# Patient Record
Sex: Male | Born: 1945
Health system: Southern US, Community
[De-identification: ages and names within clinical notes are randomized; demographics above are authoritative.]

## PROBLEM LIST (undated history)

## (undated) DIAGNOSIS — N400 Enlarged prostate without lower urinary tract symptoms: Secondary | ICD-10-CM

## (undated) DIAGNOSIS — T7840XA Allergy, unspecified, initial encounter: Secondary | ICD-10-CM

## (undated) DIAGNOSIS — I2510 Atherosclerotic heart disease of native coronary artery without angina pectoris: Secondary | ICD-10-CM

## (undated) DIAGNOSIS — L719 Rosacea, unspecified: Secondary | ICD-10-CM

## (undated) DIAGNOSIS — E785 Hyperlipidemia, unspecified: Secondary | ICD-10-CM

## (undated) DIAGNOSIS — M1990 Unspecified osteoarthritis, unspecified site: Secondary | ICD-10-CM

## (undated) DIAGNOSIS — B2790 Infectious mononucleosis, unspecified without complication: Secondary | ICD-10-CM

## (undated) DIAGNOSIS — I456 Pre-excitation syndrome: Secondary | ICD-10-CM

## (undated) DIAGNOSIS — R42 Dizziness and giddiness: Secondary | ICD-10-CM

## (undated) HISTORY — DX: Benign prostatic hyperplasia without lower urinary tract symptoms: N40.0

## (undated) HISTORY — DX: Infectious mononucleosis, unspecified without complication: B27.90

## (undated) HISTORY — DX: Allergy, unspecified, initial encounter: T78.40XA

## (undated) HISTORY — DX: Rosacea, unspecified: L71.9

## (undated) HISTORY — DX: Atherosclerotic heart disease of native coronary artery without angina pectoris: I25.10

## (undated) HISTORY — DX: Hyperlipidemia, unspecified: E78.5

## (undated) HISTORY — DX: Dizziness and giddiness: R42

## (undated) HISTORY — PX: COLONOSCOPY: SHX174

---
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
## 1974-11-09 HISTORY — PX: LAMINECTOMY: SHX219

## 1980-11-09 HISTORY — PX: VASECTOMY: SHX75

## 1981-11-09 HISTORY — PX: NASAL SEPTUM SURGERY: SHX37

## 1988-11-09 HISTORY — PX: SHOULDER SURGERY: SHX246

## 2000-10-28 ENCOUNTER — Other Ambulatory Visit: Admission: RE | Admit: 2000-10-28 | Discharge: 2000-10-28 | Payer: Self-pay | Admitting: Internal Medicine

## 2000-10-28 ENCOUNTER — Encounter (INDEPENDENT_AMBULATORY_CARE_PROVIDER_SITE_OTHER): Payer: Self-pay | Admitting: Specialist

## 2003-12-20 ENCOUNTER — Ambulatory Visit (HOSPITAL_COMMUNITY): Admission: RE | Admit: 2003-12-20 | Discharge: 2003-12-20 | Payer: Self-pay | Admitting: Neurosurgery

## 2004-11-08 ENCOUNTER — Ambulatory Visit (HOSPITAL_COMMUNITY): Admission: RE | Admit: 2004-11-08 | Discharge: 2004-11-08 | Payer: Self-pay | Admitting: *Deleted

## 2005-09-23 ENCOUNTER — Ambulatory Visit: Payer: Self-pay | Admitting: Internal Medicine

## 2005-10-05 ENCOUNTER — Encounter (INDEPENDENT_AMBULATORY_CARE_PROVIDER_SITE_OTHER): Payer: Self-pay | Admitting: *Deleted

## 2005-10-05 ENCOUNTER — Ambulatory Visit: Payer: Self-pay | Admitting: Internal Medicine

## 2010-05-07 ENCOUNTER — Encounter: Admission: RE | Admit: 2010-05-07 | Discharge: 2010-05-07 | Payer: Self-pay | Admitting: Cardiology

## 2010-06-06 ENCOUNTER — Encounter (INDEPENDENT_AMBULATORY_CARE_PROVIDER_SITE_OTHER): Payer: Self-pay | Admitting: *Deleted

## 2010-08-28 ENCOUNTER — Encounter (INDEPENDENT_AMBULATORY_CARE_PROVIDER_SITE_OTHER): Payer: Self-pay | Admitting: *Deleted

## 2010-09-29 ENCOUNTER — Encounter (INDEPENDENT_AMBULATORY_CARE_PROVIDER_SITE_OTHER): Payer: Self-pay | Admitting: *Deleted

## 2010-09-30 ENCOUNTER — Ambulatory Visit: Payer: Self-pay | Admitting: Internal Medicine

## 2010-10-09 LAB — HM COLONOSCOPY: HM Colonoscopy: NORMAL

## 2010-10-21 ENCOUNTER — Ambulatory Visit: Payer: Self-pay | Admitting: Internal Medicine

## 2010-10-22 ENCOUNTER — Encounter: Payer: Self-pay | Admitting: Internal Medicine

## 2010-11-29 ENCOUNTER — Encounter: Payer: Self-pay | Admitting: Neurosurgery

## 2010-12-09 NOTE — Letter (Signed)
Summary: [facility] Instructions  [facility]  [address] [place], [place] [address]   Phone: [phone]  Fax: [phone]       Chase [name]    Jan 24, 1946    MRN: [id number]       Procedure Day /Date:  Tuesday 10/21/2010     Arrival Time:  8:30 am     Procedure Time: 9:30 am     Location of Procedure:                    _x _  [facility] (4th Floor)   PREPARATION FOR COLONOSCOPY WITH MIRALAX  Starting 5 days prior to your procedure Thursday 12/8 do not eat nuts, seeds, popcorn, corn, beans, peas,  salads, or any raw vegetables.  Do not take any fiber supplements (e.g. Metamucil, Citrucel, and Benefiber). ____________________________________________________________________________________________________   THE DAY BEFORE YOUR PROCEDURE         DATE: Monday 12/12  1   Drink clear liquids the entire day-NO SOLID FOOD  2   Do not drink anything colored red or purple.  Avoid juices with pulp.  No orange juice.  3   Drink at least 64 oz. (8 glasses) of fluid/clear liquids during the day to prevent dehydration and help the prep work efficiently.  CLEAR LIQUIDS INCLUDE: Water Jello Ice Popsicles Tea (sugar ok, no milk/cream) Powdered fruit flavored drinks Coffee (sugar ok, no milk/cream) Gatorade Juice: apple, white grape, white cranberry  Lemonade Clear bullion, consomm, broth Carbonated beverages (any kind) Strained chicken noodle soup Hard Candy  4   Mix the entire bottle of Miralax with 64 oz. of Gatorade/Powerade in the morning and put in the refrigerator to chill.  5   At 3:00 pm take 2 Dulcolax/Bisacodyl tablets.  6   At 4:30 pm take one Reglan/Metoclopramide tablet.  7  Starting at 5:00 pm drink one 8 oz glass of the Miralax mixture every 15-20 minutes until you have finished drinking the entire 64 oz.  You should finish drinking prep around 7:30 or 8:00 pm.  8   If you are nauseated, you may take the 2nd Reglan/Metoclopramide  tablet at 6:30 pm.        9    At 8:00 pm take 2 more DULCOLAX/Bisacodyl tablets.     THE DAY OF YOUR PROCEDURE      DATE:  Tuesday 12/13  You may drink clear liquids until 7:30 am  (2 HOURS BEFORE PROCEDURE).   MEDICATION INSTRUCTIONS  Unless otherwise instructed, you should take regular prescription medications with a small sip of water as early as possible the morning of your procedure.         OTHER INSTRUCTIONS  You will need a responsible adult at least 63 years of age to accompany you and drive you home.   This person must remain in the waiting room during your procedure.  Wear loose fitting clothing that is easily removed.  Leave jewelry and other valuables at home.  However, you may wish to bring a book to read or an iPod/MP3 player to listen to music as you wait for your procedure to start.  Remove all body piercing jewelry and leave at home.  Total time from sign-in until discharge is approximately 2-3 hours.  You should go home directly after your procedure and rest.  You can resume normal activities the day after your procedure.  The day of your procedure you should not:   Drive   Make  legal decisions   Operate machinery   Drink alcohol   Return to work  You will receive specific instructions about eating, activities and medications before you leave.   The above instructions have been reviewed and explained to me by   Ezra Sites RN  September 30, 2010 9:41 AM     I fully understand and can verbalize these instructions _____________________________ Date _______

## 2010-12-09 NOTE — Letter (Signed)
Summary: Previsit letter  [facility]  [address] [place], [place] [address]   Phone: [phone]  Fax: [phone]       06/06/2010 MRN: [id number]  Chase [name] [address] [place], [place]  [address]  Dear [name],  Welcome to the Gastroenterology Division at [facility].    You are scheduled to see a nurse for your pre-procedure visit on July 17, 2010 at 8:30am on the 3rd floor at [facility], [address]  We ask that you try to arrive at our office 15 minutes prior to your appointment time to allow for check-in.  Your nurse visit will consist of discussing your medical and surgical history, your immediate family medical history, and your medications.    Please bring a complete list of all your medications or, if you prefer, bring the medication bottles and we will list them.  We will need to be aware of both prescribed and over the counter drugs.  We will need to know exact dosage information as well.  If you are on blood thinners (Coumadin, Plavix, Aggrenox, Ticlid, etc.) please call our office today/prior to your appointment, as we need to consult with your physician about holding your medication.   Please be prepared to read and sign documents such as consent forms, a financial agreement, and acknowledgement forms.  If necessary, and with your consent, a friend or relative is welcome to sit-in on the nurse visit with you.  Please bring your insurance card so that we may make a copy of it.  If your insurance requires a referral to see a specialist, please bring your referral form from your primary care physician.  No co-pay is required for this nurse visit.     If you cannot keep your appointment, please call [phone] to cancel or reschedule prior to your appointment date.  This allows [place] the opportunity to schedule an appointment for another patient in need of care.    Thank you for choosing [facility] for your  medical needs.  We appreciate the opportunity to care for you.  Please visit [place] at our website  to learn more about our practice.                     Sincerely.                                                                                                                   The Gastroenterology Division

## 2010-12-09 NOTE — Miscellaneous (Signed)
Summary: [facility]  Clinical Lists Changes  Medications: Added new medication of MIRALAX   POWD (POLYETHYLENE GLYCOL 3350) As per prep  instructions. - Signed Added new medication of DULCOLAX 5 MG  TBEC (BISACODYL) Day before procedure take 2 at 3pm and 2 at 8pm. - Signed Added new medication of REGLAN 10 MG  TABS (METOCLOPRAMIDE HCL) As per prep instructions. - Signed Rx of MIRALAX   POWD (POLYETHYLENE GLYCOL 3350) As per prep  instructions.;  #255gm x 0;  Signed;  Entered by: Ezra Sites RN;  Authorized by: Hart Carwin MD;  Method used: Electron[facility] to [facility] [address] [phone]*, [address] [place], [place], [place]  [address], Ph: [phone] or [phone], Fax: [phone] Rx of DULCOLAX 5 MG  TBEC (BISACODYL) Day before procedure take 2 at 3pm and 2 at 8pm.;  #4 x 0;  Signed;  Entered by: Ezra Sites RN;  Authorized by: Hart Carwin MD;  Method used: Electron[facility] to [facility] [address] [phone]*, [address], [place], [place], [place]  [address], Ph: [phone] or [phone], Fax: [phone] Rx of REGLAN 10 MG  TABS (METOCLOPRAMIDE HCL) As per prep instructions.;  #2 x 0;  Signed;  Entered by: Ezra Sites RN;  Authorized by: Hart Carwin MD;  Method used: Electronically to [facility] [address] [phone]*, [address], [place], [place], [place]  [address], Ph: [phone] or [phone], Fax: [phone] Allergies: Added new allergy or adverse reaction of PCN Observations: Added new observation of NKA: F (09/30/2010 9:14)    Prescriptions: REGLAN 10 MG  TABS (METOCLOPRAMIDE HCL) As per prep instructions.  #2 x 0   Entered by:   Ezra Sites RN   Authorized by:   Hart Carwin MD   Signed by:   Ezra Sites RN on 09/30/2010   Method used:   [facility]ronically to        [facility] [address] [phone]* (retail)       [address]       [place], [place]  [address]       Ph: [phone] or [phone]       Fax: [phone]   RxID:    [id number] DULCOLAX 5 MG  TBEC (BISACODYL) Day before procedure take 2 at 3pm and 2 at 8pm.  #4 x 0   Entered by:   Ezra Sites RN   Authorized by:   Hart Carwin MD   Signed by:   Ezra Sites RN on 09/30/2010   Method used:   [facility]ronically to        [facility] [address] [phone]* (retail)       [address]       [place], [place]  [address]       Ph: [phone] or [phone]       Fax: [phone]   RxID:   [id number] MIRALAX   POWD (POLYETHYLENE GLYCOL 3350) As per prep  instructions.  #255gm x 0   Entered by:   Ezra Sites RN   Authorized by:   Hart Carwin MD   Signed by:   Ezra Sites RN on 09/30/2010   Method used:   Electronically to        [facility] [address] [phone]* (retail)       [address]       [place]  [place], [place]  [address]       Ph: [phone] or [phone]       Fax: [phone]   RxID:   [id number]

## 2010-12-09 NOTE — Letter (Signed)
Summary: Pre Visit Letter Revised  [facility]  [address] [place], [place] [address]   Phone: [phone]  Fax: [phone]        08/28/2010 MRN: [id number] Chase [name] [address] [place], [place]  [address]             Procedure Date:  10/21/2010   Welcome to the Gastroenterology Division at [facility].    You are scheduled to see a nurse for your pre-procedure visit on 09/30/2010 at 9:30AM on the 3rd floor at [facility], [address].  We ask that you try to arrive at our office 15 minutes prior to your appointment time to allow for check-in.  Please take a minute to review the attached form.  If you answer "Yes" to one or more of the questions on the first page, we ask that you call the person listed at your earliest opportunity.  If you answer "No" to all of the questions, please complete the rest of the form and bring it to your appointment.    Your nurse visit will consist of discussing your medical and surgical history, your immediate family medical history, and your medications.   If you are unable to list all of your medications on the form, please bring the medication bottles to your appointment and we will list them.  We will need to be aware of both prescribed and over the counter drugs.  We will need to know exact dosage information as well.    Please be prepared to read and sign documents such as consent forms, a financial agreement, and acknowledgement forms.  If necessary, and with your consent, a friend or relative is welcome to sit-in on the nurse visit with you.  Please bring your insurance card so that we may make a copy of it.  If your insurance requires a referral to see a specialist, please bring your referral form from your primary care physician.  No co-pay is required for this nurse visit.     If you cannot keep your appointment, please call [phone] to cancel or reschedule prior to your appointment date.  This  allows [place] the opportunity to schedule an appointment for another patient in need of care.    Thank you for choosing  Gastroenterology for your medical needs.  We appreciate the opportunity to care for you.  Please visit [place] at our website  to learn more about our practice.  Sincerely, The Gastroenterology Division

## 2010-12-11 NOTE — Procedures (Signed)
Summary: Colonoscopy  Patient: Chase [name] Note: All result statuses are Final unless otherwise noted.  Tests: (1) Colonoscopy (COL)   COL Colonoscopy           DONE     [facility]     [address]     [place], [place]  [address]          COLONOSCOPY PROCEDURE REPORT          PATIENT:  [name], Chase  MR#:  [id number]     BIRTHDATE:  04/26/1946, 64 yrs. old  GENDER:  male     ENDOSCOPIST:  Dora M. Brodie, MD     REF. BY:  Thomas Brackbill, M.D.     PROCEDURE DATE:  10/21/2010     PROCEDURE:  Colonoscopy 45378     ASA CLASS:  Class I     INDICATIONS:  history of hyperplastic polyps hyperplastic polyp in     2001 and in 2006     MEDICATIONS:   Versed 10 mg, Fentanyl 100 mcg          DESCRIPTION OF PROCEDURE:   After the risks benefits and     alternatives of the procedure were thoroughly explained, informed     consent was obtained.  Digital rectal exam was performed and     revealed no rectal masses.   The [id number] [id number] endoscope was     introduced through the anus and advanced to the cecum, which was     identified by both the appendix and ileocecal valve, without     limitations.  The quality of the prep was good, using MiraLax.     The instrument was then slowly withdrawn as the colon was fully     examined.     <<PROCEDUREIMAGES>>     FINDINGS:  Mild diverticulosis was found in the sigmoid colon (see     image1, image2, and image9).  A diminutive polyp was found. at 15     cm 2 mm polyp The polyp was removed using cold biopsy forceps (see     image11 and image10).  This was otherwise a normal exa     mination of the colon (see image12, image7, image6, image5,     image3, and image4).   Retroflexed views in the rectum revealed no     abnormalities.    The scope was then withdrawn from the patient     and the procedure completed.          COMPLICATIONS:  None     ENDOSCOPIC IMPRESSION:     1) Mild diverticulosis in the sigmoid colon     2) Diminutive  polyp     3) Otherwise normal examination     RECOMMENDATIONS:     1) Await biopsy results     2) high fiber diet     REPEAT EXAM:  In 10 year(s) for.          ______________________________     Dora M. Brodie, MD          CC:          n.     eSIGNED:   Dora M. Brodie at 10/21/2010 10:02 AM          [name], Chase, [id number]  Note: An exclamation mark (!) indicates a result that was not dispersed into the flowsheet. Document Creation Date: 10/21/2010 10:04 AM _______________________________________________________________________  (1) Order result status: Final Collection or observation date-time: 10/21/2010 09:55 Requested   date-time:  Receipt date-time:  Reported date-time:  Referring Physician:   Ordering Physician: Dora Brodie ([id number]) Specimen Source:  Source: [facility] Order Number: [id number] Lab site:   Appended Document: Colonoscopy     Procedures Next Due Date:    Colonoscopy: 10/2020 

## 2010-12-11 NOTE — Letter (Signed)
Summary: Patient Notice- Polyp Results  [facility]  [address] [place], [place] [address]   Phone: [phone]  Fax: [phone]        October 22, 2010 MRN: [id number]    Chase [name] [address] [place], [place]  [address]    Dear [name],  I am pleased to inform you that the colon polyp(s) removed during your recent colonoscopy was (were) found to be benign (no cancer detected) upon pathologic examination.The polyp was hyperplastic ( NOT precancerous)  I recommend you have a repeat colonoscopy examination in 10_ years to look for recurrent polyps, as having colon polyps increases your risk for having recurrent polyps or even colon cancer in the future.  Should you develop new or worsening symptoms of abdominal pain, bowel habit changes or bleeding from the rectum or bowels, please schedule an evaluation with either your primary care physician or with me.  Additional information/recommendations:  _x_ No further action with gastroenterology is needed at this time. Please      follow-up with your primary care physician for your other healthcare      needs.  __ Please call [phone] to schedule a return visit to review your      situation.  __ Please keep your follow-up visit as already scheduled.  __ Continue treatment plan as outlined the day of your exam.  Please call us if you are having persistent problems or have questions about your condition that have not been fully answered at this time.  Sincerely,  Hart Carwin MD  This letter has been electronically signed by your physician.  Appended Document: Patient Notice- Polyp Results Letter Mailed

## 2011-01-27 NOTE — Procedures (Signed)
Summary: Colonoscopy  Patient: [name] Note: All result statuses are Final unless otherwise noted.  Tests: (1) Colonoscopy (COL)   COL Colonoscopy           DONE     [facility]     [address]     [place], [place]  [address]          COLONOSCOPY PROCEDURE REPORT          PATIENT:  Chase [name], Chase [name]  MR#:  [id number]     BIRTHDATE:  May 11, 1946, 64 yrs. old  GENDER:  male     ENDOSCOPIST:  Hedwig Morton. Juanda Chance, MD     REF. BY:  Cassell Clement, M.D.     PROCEDURE DATE:  10/21/2010     PROCEDURE:  Colonoscopy 95621     ASA CLASS:  Class I     INDICATIONS:  history of hyperplastic polyps hyperplastic polyp in     2001 and in 2006     MEDICATIONS:   Versed 10 mg, Fentanyl 100 mcg          DESCRIPTION OF PROCEDURE:   After the risks benefits and     alternatives of the procedure were thoroughly explained, informed     consent was obtained.  Digital rectal exam was performed and     revealed no rectal masses.   The [id number] [id number] endoscope was     introduced through the anus and advanced to the cecum, which was     identified by both the appendix and ileocecal valve, without     limitations.  The quality of the prep was good, using MiraLax.     The instrument was then slowly withdrawn as the colon was fully     examined.     <<PROCEDUREIMAGES>>     FINDINGS:  Mild diverticulosis was found in the sigmoid colon (see     image1, image2, and image9).  A diminutive polyp was found. at 15     cm 2 mm polyp The polyp was removed using cold biopsy forceps (see     image11 and image10).  This was otherwise a normal exa     mination of the colon (see image12, image7, image6, image5,     image3, and image4).   Retroflexed views in the rectum revealed no     abnormalities.    The scope was then withdrawn from the patient     and the procedure completed.          COMPLICATIONS:  None     ENDOSCOPIC IMPRESSION:     1) Mild diverticulosis in the sigmoid colon     2) Diminutive  polyp     3) Otherwise normal examination     RECOMMENDATIONS:     1) Await biopsy results     2) high fiber diet     REPEAT EXAM:  In 10 year(s) for.          ______________________________     Hedwig Morton. Juanda Chance, MD          CC:          n.     eSIGNED:   Hedwig Morton. Brodie at 10/21/2010 10:02 AM          Cherrie Distance, [id number]  Note: An exclamation mark (!) indicates a result that was not dispersed into the flowsheet. Document Creation Date: 10/21/2010 10:04 AM _______________________________________________________________________  (1) Order result status: Final Collection or observation date-time: 10/21/2010 09:55 Requested  date-time:  Receipt date-time:  Reported date-time:  Referring Physician:   Ordering Physician: Lina Sar [phone]) Specimen Source:  Source: Launa Grill Order Number: [id number] Lab site:   Appended Document: Colonoscopy     Procedures Next Due Date:    Colonoscopy: 10/2020

## 2011-09-09 ENCOUNTER — Telehealth: Payer: Self-pay | Admitting: Cardiology

## 2011-09-09 DIAGNOSIS — Z125 Encounter for screening for malignant neoplasm of prostate: Secondary | ICD-10-CM

## 2011-09-09 DIAGNOSIS — Z113 Encounter for screening for infections with a predominantly sexual mode of transmission: Secondary | ICD-10-CM

## 2011-09-09 DIAGNOSIS — Z139 Encounter for screening, unspecified: Secondary | ICD-10-CM

## 2011-09-09 NOTE — Telephone Encounter (Signed)
Pt calling to see when and if he needs a fu  appt  with brackbill

## 2011-09-09 NOTE — Telephone Encounter (Signed)
Scheduled appointment and labs 

## 2011-09-16 ENCOUNTER — Other Ambulatory Visit (INDEPENDENT_AMBULATORY_CARE_PROVIDER_SITE_OTHER): Payer: BC Managed Care – PPO | Admitting: *Deleted

## 2011-09-16 DIAGNOSIS — Z139 Encounter for screening, unspecified: Secondary | ICD-10-CM

## 2011-09-16 DIAGNOSIS — Z125 Encounter for screening for malignant neoplasm of prostate: Secondary | ICD-10-CM

## 2011-09-16 LAB — BASIC METABOLIC PANEL
CO2: 29 mEq/L (ref 19–32)
Calcium: 9.3 mg/dL (ref 8.4–10.5)
Chloride: 103 mEq/L (ref 96–112)
Creatinine, Ser: 1 mg/dL (ref 0.4–1.5)
GFR: 85.06 mL/min (ref >60.00)
Glucose, Bld: 101 mg/dL — ABNORMAL HIGH (ref 70–99)
Potassium: 5.1 mEq/L (ref 3.5–5.1)
Sodium: 139 mEq/L (ref 135–145)

## 2011-09-16 LAB — LIPID PANEL
Cholesterol: 199 mg/dL (ref 0–200)
HDL: 76.7 mg/dL (ref >39.00)
Total CHOL/HDL Ratio: 3
Triglycerides: 65 mg/dL (ref 0.0–149.0)
VLDL: 13 mg/dL (ref 0.0–40.0)

## 2011-09-16 LAB — HEPATIC FUNCTION PANEL
ALT: 29 U/L (ref 0–53)
AST: 31 U/L (ref 0–37)
Albumin: 4.2 g/dL (ref 3.5–5.2)
Alkaline Phosphatase: 59 U/L (ref 39–117)
Total Bilirubin: 0.9 mg/dL (ref 0.3–1.2)
Total Protein: 7.3 g/dL (ref 6.0–8.3)

## 2011-09-16 LAB — PSA: PSA: 2.64 ng/mL (ref 0.10–4.00)

## 2011-09-18 ENCOUNTER — Encounter: Payer: Self-pay | Admitting: Cardiology

## 2011-09-18 ENCOUNTER — Ambulatory Visit (INDEPENDENT_AMBULATORY_CARE_PROVIDER_SITE_OTHER): Payer: BC Managed Care – PPO | Admitting: Cardiology

## 2011-09-18 VITALS — BP 134/94 | HR 69 | Ht 68.0 in | Wt 182.1 lb

## 2011-09-18 DIAGNOSIS — I456 Pre-excitation syndrome: Secondary | ICD-10-CM

## 2011-09-18 NOTE — Progress Notes (Signed)
Chase [name] Date of Birth:  May 23, 1946 [facility] / [facility] [address]   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  The patient has been in good general health.  He has a past history of WPW syndrome.  I first saw him in 1976 at which time he had a pattern of WPW on his EKG.  The WPW pattern persisted until 1991 was not present in 1991 but had been present in 1987.  He has never had tachycardia associated with the WPW.  He had a normal Cardiolite stress test in 1999.  Current Outpatient Prescriptions  Medication Sig Dispense Refill  . ASPIRIN PO Take 81 mg by mouth daily.       . Multiple Vitamin (MULTI-VITAMIN PO) Take by mouth daily.        . Omega-3 Fatty Acids (FISH OIL PO) Take by mouth daily.          Allergies  Allergen Reactions  . Penicillins     REACTION: as child    There is no problem list on file for this patient.   History  Smoking status  . Never Smoker   Smokeless tobacco  . Not on file    History  Alcohol Use No    Family History  Problem Relation Age of Onset  . Cancer Father   . Cancer Mother     Review of Systems: Constitutional: no fever chills diaphoresis or fatigue or change in weight.  Head and neck: no hearing loss, no epistaxis, no photophobia or visual disturbance.  The patient has had some tinnitus.  This involves the left ear.  He will consult Dr. Annalee Genta Respiratory: No cough, shortness of breath or wheezing. Cardiovascular: No chest pain peripheral edema, palpitations. Gastrointestinal: No abdominal distention, no abdominal pain, no change in bowel habits hematochezia or melena. Genitourinary: No dysuria, no frequency, no urgency, no nocturia. Musculoskeletal:No arthralgias, no back pain, no gait disturbance or myalgias. Neurological: No dizziness,  no headaches, no numbness, no seizures, no syncope, no weakness, no tremors. Hematologic: No lymphadenopathy, no easy bruising. Psychiatric: No confusion, no hallucinations, no sleep disturbance.    Physical Exam: Filed Vitals:   09/18/11 1150  BP: 134/94  Pulse: 69    I repeated his blood pressure later in the examination and it was down to 130/80.  The patient appears to be in no distress.  Head and neck exam reveals that the pupils are equal and reactive.  The extraocular movements are full.  There is no scleral icterus.  Mouth and pharynx are benign.  No lymphadenopathy.  No carotid bruits.  The jugular venous pressure is normal.  Thyroid is not enlarged or tender.  The tympanic membranes are normal bilaterally  Chest is clear to percussion and auscultation.  No rales or rhonchi.  Expansion of the chest is symmetrical.  Heart reveals no abnormal lift or heave.  First and second heart sounds are normal.  There is no murmur gallop rub or click.  The abdomen is soft and nontender.  Bowel sounds are normoactive.  There is no hepatosplenomegaly or mass.  There are no abdominal bruits.  Rectal exam normal stool benzidine negative.  Prostate reveals no nodules  Extremities reveal no phlebitis or edema.  Pedal pulses are good.  There is no  cyanosis or clubbing.  Neurologic exam is normal strength and no lateralizing weakness.  No sensory deficits.  Integument reveals no rash EKG shows normal sinus rhythm and is within normal limits.  No evidence of preexcitation  Assessment / Plan: Continue same regimen.  Continue regular exercise.  Recheck in one year for followup office visit EKG lipid panel hepatic function panel basal metabolic panel PSA and CBC.

## 2011-09-18 NOTE — Assessment & Plan Note (Signed)
The patient has feeling well.  He has not had any chest pain or shortness of breath since we last saw him he had a colonoscopy in December 2011 which was negative except for some diverticulosis and a single diminutive polyp and his gastroenterologist Dr. Julio Alm told him he would not need another colonoscopy for 10 years.

## 2011-09-18 NOTE — Patient Instructions (Signed)
Your physician recommends that you continue on your current medications as directed. Please refer to the Current Medication list given to you today.  Your physician wants you to follow-up in: 1 year with fasting labs You will receive a reminder letter in the mail two months in advance. If you don't receive a letter, please call our office to schedule the follow-up appointment.  

## 2012-04-23 ENCOUNTER — Emergency Department (HOSPITAL_COMMUNITY)
Admission: EM | Admit: 2012-04-23 | Discharge: 2012-04-23 | Disposition: A | Discharge status: Home / Self Care | Payer: BC Managed Care – PPO | Attending: Emergency Medicine | Admitting: Emergency Medicine

## 2012-04-23 ENCOUNTER — Encounter (HOSPITAL_COMMUNITY): Payer: Self-pay | Admitting: Emergency Medicine

## 2012-04-23 DIAGNOSIS — S01501A Unspecified open wound of lip, initial encounter: Secondary | ICD-10-CM | POA: Insufficient documentation

## 2012-04-23 DIAGNOSIS — Z7982 Long term (current) use of aspirin: Secondary | ICD-10-CM | POA: Insufficient documentation

## 2012-04-23 DIAGNOSIS — IMO0002 Reserved for concepts with insufficient information to code with codable children: Secondary | ICD-10-CM | POA: Insufficient documentation

## 2012-04-23 DIAGNOSIS — I2510 Atherosclerotic heart disease of native coronary artery without angina pectoris: Secondary | ICD-10-CM | POA: Insufficient documentation

## 2012-04-23 DIAGNOSIS — E785 Hyperlipidemia, unspecified: Secondary | ICD-10-CM | POA: Insufficient documentation

## 2012-04-23 DIAGNOSIS — S0181XA Laceration without foreign body of other part of head, initial encounter: Secondary | ICD-10-CM

## 2012-04-23 MED ORDER — TETANUS-DIPHTHERIA TOXOIDS TD 5-2 LFU IM INJ
0.5000 mL | INJECTION | Freq: Once | INTRAMUSCULAR | Status: AC
  Administered 2012-04-23: 0.5 mL via INTRAMUSCULAR
  Filled 2012-04-23: qty 0.5

## 2012-04-23 NOTE — ED Notes (Signed)
Pt reports, that he was using bungy cord to put furniture on truck, bungy got loose and and hit pt in upper lip; pt has lac to R upper lip; no active bleeding

## 2012-04-23 NOTE — Discharge Instructions (Signed)
Facial Laceration A facial laceration is a cut on the face. It can take 1 to 2 years for the scar to heal completely. HOME CARE  For stitches (sutures):  Keep the cut clean and dry.   If you have a bandage (dressing), change it at least once a day. Change the bandage if it gets wet or dirty, or as told by your doctor.   Wash the cut with soap and water 2 times a day. Rinse the cut with water. Pat it dry with a clean towel.   Put a thin layer of medicated cream on the cut as told by your doctor.   You may shower after the first 24 hours. Do not soak the cut in water until the stitches are removed.   Only take medicines as told by your doctor.   Have your stitches removed as told by your doctor.   Do not wear makeup until a few days after your stitches are removed.  For skin adhesive strips:  Keep the cut clean and dry.   Do not get the strips wet. You may take a bath, but be careful to keep the cut dry.   If the cut gets wet, pat it dry with a clean towel.   The strips will fall off on their own. Do not remove the strips that are still stuck to the cut.  For wound glue:  You may shower or take baths. Do not soak or scrub the cut. Do not swim. Avoid heavy sweating until the glue falls off on its own. After a shower or bath, pat the cut dry with a clean towel.   Do not put medicine or makeup on your cut until the glue falls off.   If you have a bandage, do not put tape over the glue.   Avoid lots of sunlight or tanning lamps until the glue falls off. Put sunscreen on the cut for the first year to reduce your scar.   The glue will fall off on its own. Do not pick at the glue.  You may need a tetanus shot if:  You cannot remember when you had your last tetanus shot.   You have never had a tetanus shot.  If you need a tetanus shot and you choose not to have one, you may get tetanus. Sickness from tetanus can be serious. GET HELP RIGHT AWAY IF:   Your cut gets red, painful,  or puffy (swollen).   There is yellowish-white fluid (pus) coming from the cut.   You have chills or a fever.  MAKE SURE YOU:   Understand these instructions.   Will watch your condition.   Will get help right away if you are not doing well or get worse.  Document Released: 04/13/2008 Document Revised: 10/15/2011 Document Reviewed: 04/21/2011 [employer] Patient Information 2012 [employer]. 

## 2012-04-23 NOTE — ED Provider Notes (Addendum)
History   This chart was scribed for Gwyneth Sprout, MD by Shari Heritage. The patient was seen in room STRE6/STRE6. Patient's care was started at 1554.     CSN: [id number]  Arrival date & time 04/23/12  1554   None     Chief Complaint  Patient presents with  . Facial Laceration    (Consider location/radiation/quality/duration/timing/severity/associated sxs/prior treatment) Patient is a 66 y.o. male presenting with skin laceration. The history is provided by the patient. No language interpreter was used.  Laceration  The incident occurred 1 to 2 hours ago. The laceration is located on the face. The laceration is 1 cm in size. The laceration mechanism was a a blunt object. The pain is mild. He reports no foreign bodies present. His tetanus status is out of date.   Chase [name] is a 66 y.o. male who presents to the Emergency Department complaining of a facial laceration to right upper lip. Patient says he was using a bungy cord to put furniture on a truck when the cord got loose and struck him on his upper lip. Patient has no active bleeding. Patient with h/o of hyperlipidemia, mononucleosis, CAD, laminectomy, nasal septum surgery, vasectomy and shoulder surgery. Patient has never smoked.  Past Medical History  Diagnosis Date  . Mild hyperlipidemia   . Mononucleosis 1959  . Dizziness   . CAD (coronary artery disease)     Past Surgical History  Procedure Date  . Laminectomy 1976  . Nasal septum surgery 1983  . Vasectomy 1982  . Shoulder surgery 1990    Family History  Problem Relation Age of Onset  . Cancer Father   . Cancer Mother     History  Substance Use Topics  . Smoking status: Never Smoker   . Smokeless tobacco: Not on file  . Alcohol Use: No     occasionally      Review of Systems A complete 10 system review of systems was obtained and all systems are negative except as noted in the HPI and PMH.   Allergies  Penicillins  Home Medications   Current  Outpatient Rx  Name Route Sig Dispense Refill  . ASPIRIN EC 81 MG PO TBEC Oral Take 81 mg by mouth daily.    . MULTI-VITAMIN PO Oral Take by mouth daily.      Marland Kitchen FISH OIL PO Oral Take by mouth daily.        BP 141/81  Pulse 73  Temp 98.5 F (36.9 C) (Oral)  Resp 18  SpO2 94%  Physical Exam  Nursing note and vitals reviewed. Constitutional: He is oriented to person, place, and time. He appears well-developed and well-nourished.  HENT:  Head: Normocephalic and atraumatic.  Mouth/Throat: Mucous membranes are normal. Normal dentition.    Eyes: Conjunctivae and EOM are normal.  Neck: Neck supple.  Musculoskeletal: Normal range of motion.  Neurological: He is alert and oriented to person, place, and time. No sensory deficit.  Skin: Skin is warm and dry. No rash noted. No erythema.  Psychiatric: He has a normal mood and affect. His behavior is normal.    ED Course  Procedures (including critical care time) DIAGNOSTIC STUDIES: Oxygen Saturation is 94% on room air, adequate by my interpretation.    COORDINATION OF CARE: 4:08PM - Patient informed of current plan for treatment and evaluation and agrees with plan at this time. Will apply wound adhesive and update tetanus shot.   Labs Reviewed - No data to display No results found.  LACERATION REPAIR Performed by: Gwyneth Sprout Authorized byGwyneth Sprout Consent: Verbal consent obtained. Risks and benefits: risks, benefits and alternatives were discussed Consent given by: patient Patient identity confirmed: provided demographic data Prepped and Draped in normal sterile fashion Wound explored  Laceration Location: right upper lip  Laceration Length: 1cm  No Foreign Bodies seen or palpated  Anesthesia: none  Local anesthetic:none    Irrigation method: skin scrub Amount of cleaning: standard  Skin closure: dermabond  Number of sutures: 0  Patient tolerance: Patient tolerated the procedure well with no  immediate complications.  1. Facial laceration       MDM   Patient sustained a laceration to his right upper lip from a bungee cord today. There is no through and through injury no dental involvement. Tetanus shot was updated and the wound was repaired with Dermabond.      I personally performed the services described in this documentation, which was scribed in my presence.  The recorded information has been reviewed and considered.    Gwyneth Sprout, MD 04/23/12 1616  Gwyneth Sprout, MD 04/23/12 1630  Gwyneth Sprout, MD 04/23/12 1631

## 2012-04-24 ENCOUNTER — Encounter (HOSPITAL_COMMUNITY): Payer: Self-pay

## 2012-06-09 ENCOUNTER — Telehealth: Payer: Self-pay | Admitting: Cardiology

## 2012-06-09 ENCOUNTER — Emergency Department (INDEPENDENT_AMBULATORY_CARE_PROVIDER_SITE_OTHER): Payer: BC Managed Care – PPO

## 2012-06-09 ENCOUNTER — Encounter (HOSPITAL_COMMUNITY): Payer: Self-pay

## 2012-06-09 ENCOUNTER — Emergency Department (INDEPENDENT_AMBULATORY_CARE_PROVIDER_SITE_OTHER)
Admission: EM | Admit: 2012-06-09 | Discharge: 2012-06-09 | Disposition: A | Discharge status: Home / Self Care | Payer: BC Managed Care – PPO | Source: Home / Self Care

## 2012-06-09 DIAGNOSIS — R05 Cough: Secondary | ICD-10-CM

## 2012-06-09 DIAGNOSIS — I456 Pre-excitation syndrome: Secondary | ICD-10-CM | POA: Insufficient documentation

## 2012-06-09 HISTORY — DX: Pre-excitation syndrome: I45.6

## 2012-06-09 MED ORDER — LEVOFLOXACIN 500 MG PO TABS: 500.0000 mg | ORAL_TABLET | Freq: Every day | ORAL | Status: AC

## 2012-06-09 MED ORDER — ACETAMINOPHEN-CODEINE #2 300-15 MG PO TABS: 1.0000 | ORAL_TABLET | ORAL | Status: AC | PRN

## 2012-06-09 NOTE — Telephone Encounter (Signed)
Pt has a cold and it is in his chest and getting bad and wants to be seen today or tomorrow

## 2012-06-09 NOTE — ED Notes (Signed)
C/o sick for 2 weeks w/o improvement; c/o dry , non-productive cough, chest getting sore

## 2012-06-09 NOTE — ED Provider Notes (Signed)
Chase [name] is a 66 y.o. male who presents to Urgent Care today for persistent cough.  Patient became ill about 10 days ago with fever chills and cough.  He was sick enough that he had missed several days of work which is on characteristic of him.  His other symptoms have resolved however he still has a persistent nonproductive cough.  He denies any trouble breathing or palpitations nasal congestion discharge or allergy symptoms.  He is taking day and night time cold medications which have not helped.     PMH reviewed. Significant for coronary artery disease and Wolff-Parkinson-White syndrome History  Substance Use Topics  . Smoking status: Never Smoker   . Smokeless tobacco: Not on file  . Alcohol Use: No     occasionally   ROS as above Medications reviewed. No current facility-administered medications for this encounter.   Current Outpatient Prescriptions  Medication Sig Dispense Refill  . acetaminophen-codeine (TYLENOL #2) 300-15 MG per tablet Take 1 tablet by mouth every 4 (four) hours as needed (cough).  30 tablet  0  . aspirin EC 81 MG tablet Take 81 mg by mouth daily.      Marland Kitchen levofloxacin (LEVAQUIN) 500 MG tablet Take 1 tablet (500 mg total) by mouth daily.  7 tablet  0  . Multiple Vitamin (MULTI-VITAMIN PO) Take by mouth daily.        . Omega-3 Fatty Acids (FISH OIL PO) Take by mouth daily.          Exam:  BP 134/85  Pulse 70  Temp 99.1 F (37.3 C) (Oral)  Resp 16  SpO2 98% Gen: Well NAD HEENT: EOMI,  MMM, mild injection of the tympanic membranes bilaterally without effusion. No significant cobblestoning of the posterior pharynx Lungs: CTABL Nl WOB Heart: RRR no MRG Abd: NABS, NT, ND Exts: Non edematous BL  LE, warm and well perfused.   Independent reviewed the below x-ray and agree with the findings Dg Chest 2 View  06/09/2012  *RADIOLOGY REPORT*  Clinical Data: Cough.  Fever.  CHEST - 2 VIEW  Comparison: 05/07/2010.  Findings: Small focus patchy airspace disease is  present over the left hemidiaphragm near the cardiac apex.  This is likely within the lingula, not readily identified in the left lower lobe on the lateral view.  Thoracic spine DISH.  Right lung is clear.  Follow-up to ensure radiographic clearing recommended.  Clearing is usually observed at 8 weeks.  IMPRESSION: Small focus of lingular pneumonia.  Original Report Authenticated By: Andreas Newport, M.D.    Assessment and Plan: 66 y.o. male with this referral cough plus/minus pneumonia.  He has a possible pneumonia localized on x-ray however did not have fever or difficulty breathing on exam.  Additionally his oxygen saturation is within normal limits.  Discussed the risks versus benefits of taking an antibiotic.  Patient elects to wait a few days to see if it resolves spontaneously.  Will control the cough with codeine cough medicine.  Recommend continuing over-the-counter cough medicine as tolerated.  Discussed warning signs or symptoms. Please see discharge instructions. Patient expresses understanding.      Rodolph Bong, MD 06/09/12 [phone]

## 2012-06-09 NOTE — Telephone Encounter (Signed)
Last Tuesday started with chills, fever, aches and missed 2 days of work. Has had dry cough and using Mucinex and still and not getting anything up. Concerned about bronchitis.  Advised  Dr. Patty Sermons at the hospital today and should probably go to urgent care to be evaluated

## 2012-06-11 NOTE — ED Provider Notes (Signed)
Medical screening examination/treatment/procedure(s) were performed by non-physician practitioner and as supervising physician I was immediately available for consultation/collaboration.  Raynald Blend, MD 06/11/12 1040

## 2012-09-20 ENCOUNTER — Other Ambulatory Visit: Payer: Self-pay | Admitting: Neurosurgery

## 2012-09-20 DIAGNOSIS — M545 Low back pain: Secondary | ICD-10-CM

## 2012-09-23 ENCOUNTER — Ambulatory Visit
Admission: RE | Admit: 2012-09-23 | Discharge: 2012-09-23 | Disposition: A | Discharge status: Home / Self Care | Payer: BC Managed Care – PPO | Source: Ambulatory Visit | Attending: Neurosurgery | Admitting: Neurosurgery

## 2012-09-23 DIAGNOSIS — M545 Low back pain: Secondary | ICD-10-CM

## 2012-09-26 ENCOUNTER — Other Ambulatory Visit: Payer: Self-pay | Admitting: Neurosurgery

## 2012-09-26 DIAGNOSIS — M549 Dorsalgia, unspecified: Secondary | ICD-10-CM

## 2012-09-27 ENCOUNTER — Ambulatory Visit
Admission: RE | Admit: 2012-09-27 | Discharge: 2012-09-27 | Disposition: A | Discharge status: Home / Self Care | Payer: BC Managed Care – PPO | Source: Ambulatory Visit | Attending: Neurosurgery | Admitting: Neurosurgery

## 2012-09-27 DIAGNOSIS — M549 Dorsalgia, unspecified: Secondary | ICD-10-CM

## 2012-09-27 MED ORDER — IOHEXOL 180 MG/ML  SOLN
1.0000 mL | Freq: Once | INTRAMUSCULAR | Status: AC | PRN
  Administered 2012-09-27: 1 mL via EPIDURAL

## 2012-09-27 MED ORDER — METHYLPREDNISOLONE ACETATE 40 MG/ML INJ SUSP (RADIOLOG
120.0000 mg | Freq: Once | INTRAMUSCULAR | Status: AC
  Administered 2012-09-27: 120 mg via EPIDURAL

## 2012-10-04 ENCOUNTER — Other Ambulatory Visit: Payer: Self-pay | Admitting: Neurosurgery

## 2012-10-04 DIAGNOSIS — M545 Low back pain: Secondary | ICD-10-CM

## 2012-10-05 IMAGING — CR DG CHEST 2V
2 series · 2 of 2 positions shown · non-contrast
Comparison: 05/07/2010.

CLINICAL DATA: Cough.  Fever.

CHEST - 2 VIEW

[view not recorded (1 of 2)]
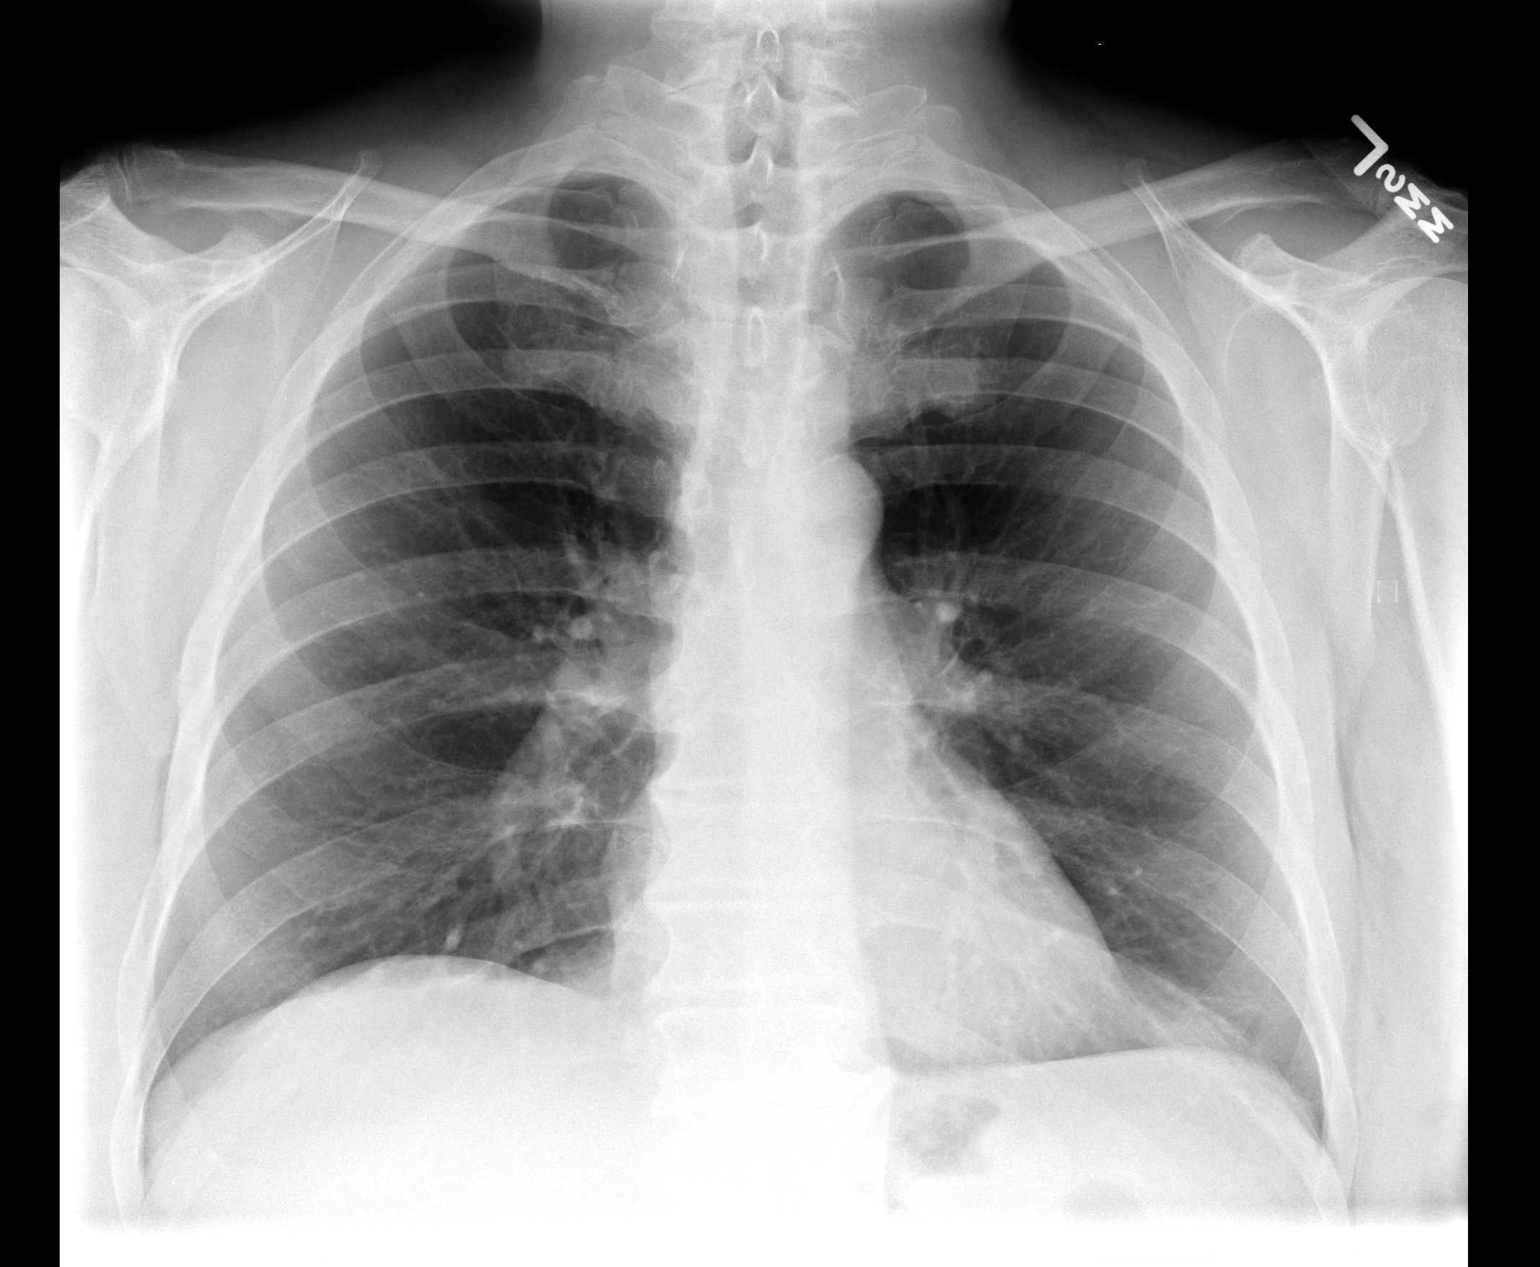

[view not recorded (2 of 2)]
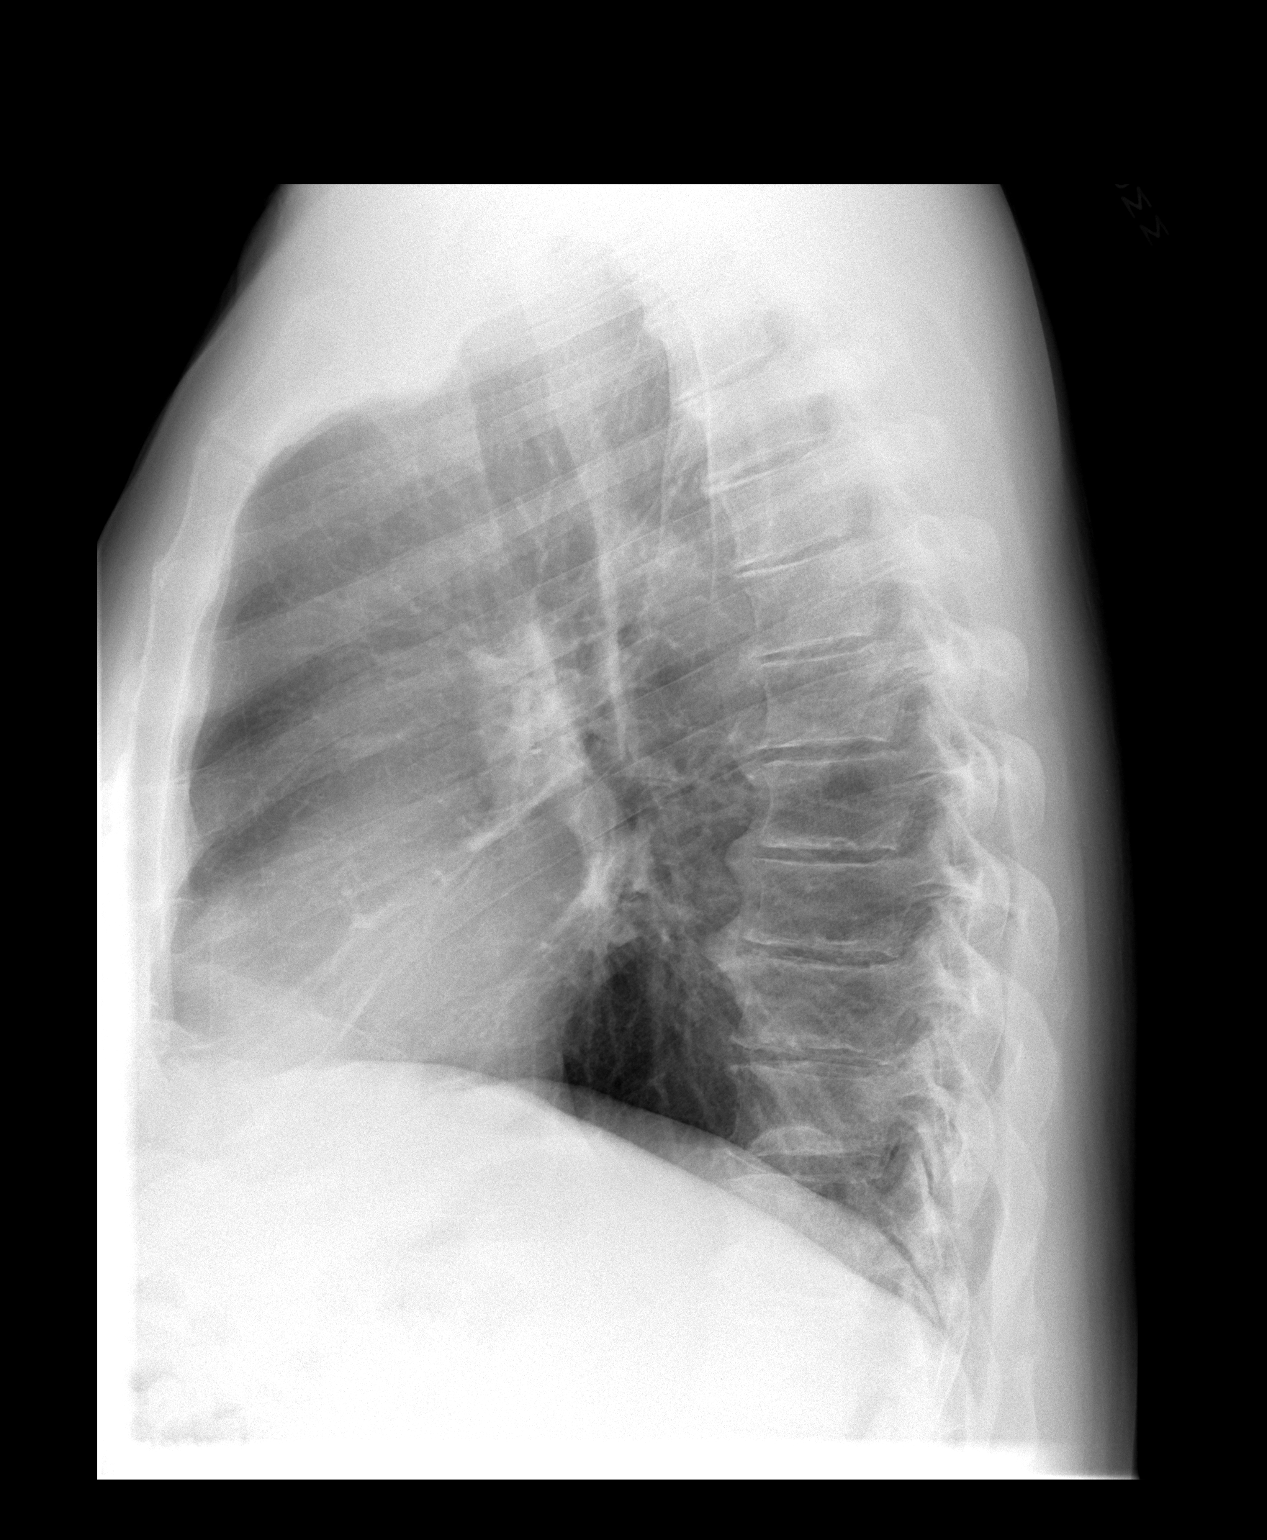

[2 of 2 positions shown; findings below may reference images not displayed]

FINDINGS: Small focus patchy airspace disease is present over the
left hemidiaphragm near the cardiac apex.  This is likely within
the lingula, not readily identified in the left lower lobe on the
lateral view.

Thoracic spine DISH.  Right lung is clear.

Follow-up to ensure radiographic clearing recommended.  Clearing is
usually observed at 8 weeks.
IMPRESSION: Small focus of lingular pneumonia.

## 2012-10-10 ENCOUNTER — Ambulatory Visit
Admission: RE | Admit: 2012-10-10 | Discharge: 2012-10-10 | Disposition: A | Discharge status: Home / Self Care | Payer: BC Managed Care – PPO | Source: Ambulatory Visit | Attending: Neurosurgery | Admitting: Neurosurgery

## 2012-10-10 VITALS — BP 145/86 | HR 63

## 2012-10-10 DIAGNOSIS — M545 Low back pain: Secondary | ICD-10-CM

## 2012-10-10 DIAGNOSIS — M5126 Other intervertebral disc displacement, lumbar region: Secondary | ICD-10-CM

## 2012-10-10 MED ORDER — METHYLPREDNISOLONE ACETATE 40 MG/ML INJ SUSP (RADIOLOG
120.0000 mg | Freq: Once | INTRAMUSCULAR | Status: AC
  Administered 2012-10-10: 120 mg via EPIDURAL

## 2012-10-10 MED ORDER — IOHEXOL 180 MG/ML  SOLN
1.0000 mL | Freq: Once | INTRAMUSCULAR | Status: AC | PRN
  Administered 2012-10-10: 1 mL via EPIDURAL

## 2013-04-04 ENCOUNTER — Encounter: Payer: Self-pay | Admitting: Cardiology

## 2013-06-21 ENCOUNTER — Ambulatory Visit: Payer: Self-pay | Admitting: Internal Medicine

## 2013-08-11 DIAGNOSIS — H5210 Myopia, unspecified eye: Secondary | ICD-10-CM | POA: Diagnosis not present

## 2013-08-11 DIAGNOSIS — H10409 Unspecified chronic conjunctivitis, unspecified eye: Secondary | ICD-10-CM | POA: Diagnosis not present

## 2013-08-11 DIAGNOSIS — H524 Presbyopia: Secondary | ICD-10-CM | POA: Diagnosis not present

## 2013-08-11 DIAGNOSIS — H35379 Puckering of macula, unspecified eye: Secondary | ICD-10-CM | POA: Diagnosis not present

## 2013-08-29 ENCOUNTER — Ambulatory Visit (INDEPENDENT_AMBULATORY_CARE_PROVIDER_SITE_OTHER): Payer: Medicare Other | Admitting: Internal Medicine

## 2013-08-29 ENCOUNTER — Encounter: Payer: Self-pay | Admitting: Internal Medicine

## 2013-08-29 VITALS — BP 126/80 | HR 72 | Temp 96.7°F | Ht 67.0 in | Wt 166.8 lb

## 2013-08-29 DIAGNOSIS — I456 Pre-excitation syndrome: Secondary | ICD-10-CM | POA: Diagnosis not present

## 2013-08-29 DIAGNOSIS — Z23 Encounter for immunization: Secondary | ICD-10-CM | POA: Diagnosis not present

## 2013-08-29 DIAGNOSIS — Z0000 Encounter for general adult medical examination without abnormal findings: Secondary | ICD-10-CM | POA: Diagnosis not present

## 2013-08-29 DIAGNOSIS — E785 Hyperlipidemia, unspecified: Secondary | ICD-10-CM

## 2013-08-29 DIAGNOSIS — M48061 Spinal stenosis, lumbar region without neurogenic claudication: Secondary | ICD-10-CM

## 2013-08-29 NOTE — Progress Notes (Signed)
Patient ID: Chase [name], male   DOB: [birth year], 67 y.o.   MRN: [id number]  Chief Complaint  Patient presents with  . Medical Managment of Chronic Issues    NP- establish care   Allergies  Allergen Reactions  . Penicillins     as child; not certain of reaction    HPI- 67 y/o male patient is here to establish care. He was seeing Dr Patty Sermons as his PCP priro to this and last seen in 2012. He has hx of WPW which has now resolved. Besides this, he has chronic back issues and has undergone epidural injection for this. Reviewed his last MRI result. 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  Last physical in [facility] 2012  Exercises on regular basis Eats healthy meals. Eats good breakfast and takes salad and lean meat for lunch and protein for dinner Reviewed his last colonoscopy result uptodate with influenza vaccine Has not had pneumococcal vaccine Has not had shingles vaccine Never been a smoker in the past   Review of Systems  Constitutional: Positive for weight loss. Negative for fever, chills, malaise/fatigue and diaphoresis.       Lost about 20 lbs over this year intentionally  HENT: Negative for congestion, ear discharge, ear pain, hearing loss, nosebleeds, sore throat and tinnitus.   Eyes: Negative for blurred vision, double vision, photophobia, discharge and redness.       Wears corrective lenses and follows with [facility]. No glaucoma reported  Respiratory: Negative for cough, hemoptysis, sputum production, shortness of breath, wheezing and stridor.   Cardiovascular: Negative for chest pain, palpitations, orthopnea, claudication, leg swelling and PND.  Gastrointestinal: Negative for heartburn, nausea, vomiting, abdominal pain, diarrhea, constipation, blood in stool and melena.       Last colonoscopy in 10/21/10 with history of hyperplastic  polyps in 2001 and 2006 but rpeat in 2011 showed mild diverticulosis in sigmoid colon, diminutive polyp and repeat exam recommended in 10 years  Genitourinary: Negative for dysuria, urgency, frequency, hematuria and flank pain.  Musculoskeletal: Negative for back pain, falls, joint pain, myalgias and neck pain.       Dr Gerlene Fee neurosurgeon for his chronic back issues  Skin: Negative for itching and rash.  Neurological: Negative for dizziness, tingling, tremors, sensory change, speech change, focal weakness, seizures, loss of consciousness and headaches.  Endo/Heme/Allergies: Negative for polydipsia. Does not bruise/bleed easily.  Psychiatric/Behavioral: Negative for depression, suicidal ideas, hallucinations, memory loss and substance abuse. The patient is not nervous/anxious and does not have insomnia.    Past Medical History  Diagnosis Date  . Mild hyperlipidemia   . Mononucleosis 1959  . Dizziness   . CAD (coronary artery disease)   . Wolf-Parkinson-White syndrome   . Rosacea    Past Surgical History  Procedure Laterality Date  . Laminectomy  1976  . Nasal septum surgery  1983  . Vasectomy  1982  . Shoulder surgery  1990   Family History  Problem Relation Age of Onset  . Cancer Father   . Prostate cancer Father   . Cancer Mother   . Heart disease Mother   . Cancer - Cervical Mother    History   Social History  . Marital Status: Married    Spouse Name: N/A    Number of Children: N/A  . Years of Education: N/A   Occupational History  . Not on file.   Social History Main  Topics  . Smoking status: Never Smoker   . Smokeless tobacco: Not on file  . Alcohol Use: Yes     Comment: socially  . Drug Use: No  . Sexual Activity: Not on file   Other Topics Concern  . Not on file   Social History Narrative  . No narrative on file   Current Outpatient Prescriptions on File Prior to Visit  Medication Sig Dispense Refill  . aspirin EC 81 MG tablet Take 81 mg by mouth  daily.      . Multiple Vitamin (MULTI-VITAMIN PO) Take by mouth daily.        . Omega-3 Fatty Acids (FISH OIL PO) Take by mouth daily.         No current facility-administered medications on file prior to visit.    Physical exam BP 126/80  Pulse 72  Temp(Src) 96.7 F (35.9 C) (Oral)  Ht 5\' 7"  (1.702 m)  Wt 166 lb 12.8 oz (75.66 kg)  BMI 26.12 kg/m2  SpO2 95%  General- elderly male in no acute distress Head- atraumatic, normocephalic Eyes- PERRLA, EOMI, no pallor, no icterus Neck- no lymphadenopathy, no thyromegaly, no jugular vein distension, no carotid bruit Chest- no chest wall deformities, no chest wall tenderness Cardiovascular- normal s1,s2, no murmurs/ rubs/ gallops Respiratory- bilateral clear to auscultation, no wheeze, no rhonchi, no crackles Abdomen- bowel sounds present, soft, non tender, no organomegaly, no abdominal bruits, no guarding or rigidity, no CVA tenderness Genital- descended testes, no hydrocele, normal rectal exam, normal prostate exam, FOBT negative Musculoskeletal- able to move all 4 extremities, no spinal and paraspinal tenderness, steady gait, no use of assistive device. Has surgical scar in lower back Neurological- no focal deficit, normal reflexes, normal muscle strength, normal sensation to fine touch and vibration Psychiatry- alert and oriented to person, place and time, normal mood and affect  Labs-  No recent blood work  Assessment/plan  Routine general exam- Will provide pneumococcal vaccine Will provide script for shingles vaccine uptodate with influenza vaccine Reviewed colonoscopy result Check cbc, cmp, flp  WPW syndrome- Reviewed ekg in office today showing NSR, no axis deviation, no st-t wave changes  Spinal stenosis- Follows with neurosurgery and gets epidural injections, monitor clinically  Hyperlipidemia- Check flp. currentl off all medication. Continue baby aspirin. Diet and exercise counselling provided

## 2013-08-30 ENCOUNTER — Other Ambulatory Visit: Payer: Self-pay | Admitting: *Deleted

## 2013-08-30 DIAGNOSIS — E785 Hyperlipidemia, unspecified: Secondary | ICD-10-CM | POA: Insufficient documentation

## 2013-08-30 DIAGNOSIS — M48061 Spinal stenosis, lumbar region without neurogenic claudication: Secondary | ICD-10-CM | POA: Insufficient documentation

## 2013-09-07 ENCOUNTER — Other Ambulatory Visit: Payer: Medicare Other

## 2013-09-07 DIAGNOSIS — E785 Hyperlipidemia, unspecified: Secondary | ICD-10-CM

## 2013-09-07 DIAGNOSIS — I456 Pre-excitation syndrome: Secondary | ICD-10-CM

## 2013-09-08 LAB — CBC WITH DIFFERENTIAL/PLATELET
Basophils Absolute: 0 10*3/uL (ref 0.0–0.2)
Eosinophils Absolute: 0.2 10*3/uL (ref 0.0–0.4)
HCT: 40.6 % (ref 37.5–51.0)
Immature Grans (Abs): 0 10*3/uL (ref 0.0–0.1)
Immature Granulocytes: 0 %
Lymphocytes Absolute: 1.6 10*3/uL (ref 0.7–3.1)
MCH: 29.2 pg (ref 26.6–33.0)
MCHC: 32.5 g/dL (ref 31.5–35.7)
MCV: 90 fL (ref 79–97)
Monocytes Absolute: 0.5 10*3/uL (ref 0.1–0.9)
Monocytes: 12 %
Neutrophils Absolute: 1.9 10*3/uL (ref 1.4–7.0)
Neutrophils Relative %: 44 %
RDW: 14.1 % (ref 12.3–15.4)

## 2013-09-08 LAB — COMPREHENSIVE METABOLIC PANEL
ALT: 21 IU/L (ref 0–44)
AST: 24 IU/L (ref 0–40)
CO2: 27 mmol/L (ref 18–29)
Calcium: 9.3 mg/dL (ref 8.6–10.2)
Chloride: 101 mmol/L (ref 97–108)
Creatinine, Ser: 0.97 mg/dL (ref 0.76–1.27)
GFR calc Af Amer: 94 mL/min/{1.73_m2} (ref >59)
Globulin, Total: 2.3 g/dL (ref 1.5–4.5)
Potassium: 4.9 mmol/L (ref 3.5–5.2)
Sodium: 140 mmol/L (ref 134–144)

## 2013-09-08 LAB — LIPID PANEL
Chol/HDL Ratio: 2.1 ratio units (ref 0.0–5.0)
Cholesterol, Total: 188 mg/dL (ref 100–199)
HDL: 88 mg/dL (ref >39)
Triglycerides: 79 mg/dL (ref 0–149)

## 2013-09-12 NOTE — Addendum Note (Signed)
Addended by: Maurice Small on: 09/12/2013 05:11 PM   Modules accepted: Orders

## 2013-09-21 ENCOUNTER — Encounter: Payer: Self-pay | Admitting: Internal Medicine

## 2013-09-27 ENCOUNTER — Other Ambulatory Visit: Payer: Self-pay | Admitting: *Deleted

## 2013-09-27 ENCOUNTER — Telehealth: Payer: Self-pay | Admitting: *Deleted

## 2013-09-27 NOTE — Telephone Encounter (Signed)
Patient called and stated that he is new with you and just had a NP appointment. Will be traveling soon and his former Dr. Had prescribed him Zolpidem to help sleep. He said the low dose would work just fine. Would like to pick up a Rx for this. Please Advise.

## 2013-10-01 NOTE — Telephone Encounter (Signed)
Ok to provide script for zolpidem immediate release 5 mg po daily as needed before bedtimes to help with sleep

## 2013-10-02 MED ORDER — ZOLPIDEM TARTRATE 5 MG PO TABS: 5.0000 mg | ORAL_TABLET | Freq: Every evening | ORAL | Status: AC | PRN

## 2013-10-02 NOTE — Telephone Encounter (Signed)
Left message on VM informing patient rx is ready for pick-up

## 2014-04-13 DIAGNOSIS — R3915 Urgency of urination: Secondary | ICD-10-CM | POA: Diagnosis not present

## 2014-04-13 DIAGNOSIS — N139 Obstructive and reflux uropathy, unspecified: Secondary | ICD-10-CM | POA: Diagnosis not present

## 2014-04-13 DIAGNOSIS — R350 Frequency of micturition: Secondary | ICD-10-CM | POA: Diagnosis not present

## 2014-04-13 DIAGNOSIS — N138 Other obstructive and reflux uropathy: Secondary | ICD-10-CM | POA: Diagnosis not present

## 2014-04-13 DIAGNOSIS — N401 Enlarged prostate with lower urinary tract symptoms: Secondary | ICD-10-CM | POA: Diagnosis not present

## 2014-05-01 DIAGNOSIS — J310 Chronic rhinitis: Secondary | ICD-10-CM | POA: Diagnosis not present

## 2014-05-01 DIAGNOSIS — H6980 Other specified disorders of Eustachian tube, unspecified ear: Secondary | ICD-10-CM | POA: Diagnosis not present

## 2014-05-01 DIAGNOSIS — H9319 Tinnitus, unspecified ear: Secondary | ICD-10-CM | POA: Diagnosis not present

## 2014-06-14 DIAGNOSIS — H903 Sensorineural hearing loss, bilateral: Secondary | ICD-10-CM | POA: Diagnosis not present

## 2014-06-14 DIAGNOSIS — H6980 Other specified disorders of Eustachian tube, unspecified ear: Secondary | ICD-10-CM | POA: Diagnosis not present

## 2014-06-14 DIAGNOSIS — H9319 Tinnitus, unspecified ear: Secondary | ICD-10-CM | POA: Diagnosis not present

## 2014-06-14 DIAGNOSIS — J310 Chronic rhinitis: Secondary | ICD-10-CM | POA: Diagnosis not present

## 2014-06-29 ENCOUNTER — Encounter: Payer: Self-pay | Admitting: Internal Medicine

## 2014-06-29 ENCOUNTER — Ambulatory Visit (INDEPENDENT_AMBULATORY_CARE_PROVIDER_SITE_OTHER): Payer: Medicare Other | Admitting: Internal Medicine

## 2014-06-29 VITALS — BP 120/78 | HR 57 | Temp 97.2°F | Ht 67.0 in | Wt 172.4 lb

## 2014-06-29 DIAGNOSIS — S70361A Insect bite (nonvenomous), right thigh, initial encounter: Secondary | ICD-10-CM

## 2014-06-29 DIAGNOSIS — S90569A Insect bite (nonvenomous), unspecified ankle, initial encounter: Secondary | ICD-10-CM | POA: Diagnosis not present

## 2014-06-29 DIAGNOSIS — W57XXXA Bitten or stung by nonvenomous insect and other nonvenomous arthropods, initial encounter: Principal | ICD-10-CM

## 2014-06-29 MED ORDER — DOXYCYCLINE HYCLATE 100 MG PO TABS: 100.0000 mg | ORAL_TABLET | Freq: Two times a day (BID) | ORAL | Status: DC

## 2014-06-29 NOTE — Progress Notes (Signed)
Patient ID: Chase [name], male   DOB: 09/10/1946, 68 y.o.   MRN: [id number]   Location:  [facility]  Allergies  Allergen Reactions  . Penicillins     as child; not certain of reaction    Chief Complaint  Patient presents with  . Acute Visit    tick bite on back of his RT thigh  x 2 days, still a red area there  . 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  Was out over the weekend and then with a neighbor's dog on Sunday.   [place] area [place] around Had rolled trash out to street  tues night discovered had tick on back of right posterior thigh--was very small found no other ticks Has small red spot with knot under it Has not seen classic target sign Wants to make sure nothing needs to be done Worries about long term effects of lyme  WPW was always asymptomatic and only showed up on ekg, but since has disappeared.   Review of Systems:  Review of Systems  Constitutional: Negative for fever, chills and malaise/fatigue.  Cardiovascular: Negative for chest pain and palpitations.  Skin: Negative for itching and rash.       Spot on right posterior thigh where tick was present     Past Medical History  Diagnosis Date  . Mild hyperlipidemia   . Mononucleosis 1959  . Dizziness   . CAD (coronary artery disease)   . Wolf-Parkinson-White syndrome   . Rosacea   . BPH (benign prostatic hyperplasia)     Past Surgical History  Procedure Laterality Date  . Laminectomy  1976  . Nasal septum surgery  1983  . Vasectomy  1982  . Shoulder surgery  1990    Social History:   reports that he has never smoked. He does not have any smokeless tobacco history on file. He reports that he drinks alcohol. He reports that he does not use illicit drugs.  Family History  Problem Relation Age of Onset  . Cancer Father   . Prostate cancer  Father   . Cancer Mother   . Heart disease Mother   . Cancer - Cervical Mother     Medications: Patient's Medications  New Prescriptions   No medications on file  Previous Medications   ASPIRIN EC 81 MG TABLET    Take 81 mg by mouth daily.   FINASTERIDE (PROSCAR) 5 MG TABLET    Take 5 mg by mouth daily.   MULTIPLE VITAMIN (MULTI-VITAMIN PO)    Take by mouth daily.     OMEGA-3 FATTY ACIDS (FISH OIL PO)    Take by mouth daily.     ZOLPIDEM (AMBIEN) 5 MG TABLET    Take 1 tablet (5 mg total) by mouth at bedtime as needed for sleep.  Modified Medications   No medications on file  Discontinued Medications   ZOSTER VACCINE LIVE, PF, (ZOSTAVAX) 21194 UNT/0.65ML INJECTION    Inject 0.65 mLs into the skin once.     Physical Exam: Filed Vitals:   06/29/14 1122  BP: 120/78  Pulse: 57  Temp: 97.2 F (36.2 C)  TempSrc: Oral  Height: 5\' 7"  (1.702 m)  Weight: 172 lb 6.4 oz (78.2 kg)  SpO2: 97%  Physical Exam  Constitutional: He appears well-developed  and well-nourished. No distress.  Cardiovascular: Normal rate, regular rhythm, normal heart sounds and intact distal pulses.   Pulmonary/Chest: Effort normal.  Skin:  Right posterior thigh with 1/2 inch round erythematous bite, slight palpable raised area beneath it, nontender, no drainage, no erythema migrans present  Psychiatric: He has a normal mood and affect.    Labs reviewed: Basic Metabolic Panel:  Recent Labs  09/07/13 0814  NA 140  K 4.9  CL 101  CO2 27  GLUCOSE 96  BUN 13  CREATININE 0.97  CALCIUM 9.3   Liver Function Tests:  Recent Labs  09/07/13 0814  AST 24  ALT 21  ALKPHOS 56  BILITOT 0.4  PROT 6.5   No results found for this basename: LIPASE, AMYLASE,  in the last 8760 hours No results found for this basename: AMMONIA,  in the last 8760 hours CBC:  Recent Labs  09/07/13 0814  WBC 4.2  NEUTROABS 1.9  HGB 13.2  HCT 40.6  MCV 90   Lipid Panel:  Recent Labs  09/07/13 0814  HDL 88  LDLCALC  84  TRIG 79  CHOLHDL 2.1   Assessment/Plan 1. Tick bite of thigh with local reaction, right, initial encounter - will treat prophylactically due to evidence of bite and description of tick likely being deer tick--did not have true erythema migrans rash, but is concerned about potential long term effects - doxycycline (VIBRA-TABS) 100 MG tablet; Take 1 tablet (100 mg total) by mouth 2 (two) times daily.  Dispense: 20 tablet; Refill: 0 -instructions for yogurt and sun avoidance provided  Labs/tests ordered:  none Next appt:  Keep as scheduled with Dr. Bubba Camp

## 2014-06-29 NOTE — Patient Instructions (Signed)
Continue to eat your yogurt daily while taking doxycycline and avoid direct sunlight during the time you are taking the antibiotic.

## 2014-08-09 DIAGNOSIS — H01004 Unspecified blepharitis left upper eyelid: Secondary | ICD-10-CM | POA: Diagnosis not present

## 2014-08-09 DIAGNOSIS — H01001 Unspecified blepharitis right upper eyelid: Secondary | ICD-10-CM | POA: Diagnosis not present

## 2014-08-27 ENCOUNTER — Other Ambulatory Visit: Payer: Self-pay | Admitting: *Deleted

## 2014-08-27 ENCOUNTER — Other Ambulatory Visit: Payer: Medicare Other

## 2014-08-27 DIAGNOSIS — G0439 Other acute necrotizing hemorrhagic encephalopathy: Secondary | ICD-10-CM

## 2014-08-27 DIAGNOSIS — C61 Malignant neoplasm of prostate: Secondary | ICD-10-CM

## 2014-08-27 DIAGNOSIS — E785 Hyperlipidemia, unspecified: Secondary | ICD-10-CM

## 2014-08-27 DIAGNOSIS — Z0000 Encounter for general adult medical examination without abnormal findings: Secondary | ICD-10-CM

## 2014-08-28 LAB — COMPREHENSIVE METABOLIC PANEL
ALK PHOS: 62 IU/L (ref 39–117)
ALT: 29 IU/L (ref 0–44)
AST: 31 IU/L (ref 0–40)
Albumin/Globulin Ratio: 1.7 (ref 1.1–2.5)
Albumin: 4.3 g/dL (ref 3.6–4.8)
BILIRUBIN TOTAL: 0.3 mg/dL (ref 0.0–1.2)
BUN/Creatinine Ratio: 15 (ref 10–22)
BUN: 16 mg/dL (ref 8–27)
CO2: 26 mmol/L (ref 18–29)
Calcium: 9.2 mg/dL (ref 8.6–10.2)
Chloride: 103 mmol/L (ref 97–108)
Creatinine, Ser: 1.05 mg/dL (ref 0.76–1.27)
GFR calc Af Amer: 85 mL/min/{1.73_m2} (ref >59)
GFR calc non Af Amer: 74 mL/min/{1.73_m2} (ref >59)
Globulin, Total: 2.5 g/dL (ref 1.5–4.5)
Glucose: 95 mg/dL (ref 65–99)
Potassium: 5 mmol/L (ref 3.5–5.2)
Sodium: 143 mmol/L (ref 134–144)
Total Protein: 6.8 g/dL (ref 6.0–8.5)

## 2014-08-28 LAB — PSA: PSA: 1.3 ng/mL (ref 0.0–4.0)

## 2014-08-28 LAB — CBC WITH DIFFERENTIAL/PLATELET
BASOS ABS: 0 10*3/uL (ref 0.0–0.2)
Basos: 1 %
Eos: 2 %
Eosinophils Absolute: 0.1 10*3/uL (ref 0.0–0.4)
HCT: 44.7 % (ref 37.5–51.0)
HEMOGLOBIN: 14.2 g/dL (ref 12.6–17.7)
IMMATURE GRANS (ABS): 0 10*3/uL (ref 0.0–0.1)
Immature Granulocytes: 0 %
LYMPHS: 37 %
Lymphocytes Absolute: 1.5 10*3/uL (ref 0.7–3.1)
MCH: 30 pg (ref 26.6–33.0)
MCHC: 31.8 g/dL (ref 31.5–35.7)
MCV: 94 fL (ref 79–97)
MONOCYTES: 10 %
Monocytes Absolute: 0.4 10*3/uL (ref 0.1–0.9)
NEUTROS ABS: 2 10*3/uL (ref 1.4–7.0)
Neutrophils Relative %: 50 %
RBC: 4.74 x10E6/uL (ref 4.14–5.80)
RDW: 14.7 % (ref 12.3–15.4)
WBC: 3.9 10*3/uL (ref 3.4–10.8)

## 2014-08-28 LAB — LIPID PANEL
CHOL/HDL RATIO: 2.6 ratio (ref 0.0–5.0)
Cholesterol, Total: 187 mg/dL (ref 100–199)
HDL: 73 mg/dL (ref >39)
LDL Calculated: 105 mg/dL — ABNORMAL HIGH (ref 0–99)
Triglycerides: 47 mg/dL (ref 0–149)
VLDL Cholesterol Cal: 9 mg/dL (ref 5–40)

## 2014-08-29 ENCOUNTER — Encounter: Payer: Medicare Other | Admitting: Internal Medicine

## 2014-09-03 DIAGNOSIS — L718 Other rosacea: Secondary | ICD-10-CM | POA: Diagnosis not present

## 2014-09-24 ENCOUNTER — Emergency Department (INDEPENDENT_AMBULATORY_CARE_PROVIDER_SITE_OTHER)
Admission: EM | Admit: 2014-09-24 | Discharge: 2014-09-24 | Disposition: A | Discharge status: Home / Self Care | Payer: Medicare Other | Source: Home / Self Care | Attending: Family Medicine | Admitting: Family Medicine

## 2014-09-24 ENCOUNTER — Encounter (HOSPITAL_COMMUNITY): Payer: Self-pay

## 2014-09-24 DIAGNOSIS — J40 Bronchitis, not specified as acute or chronic: Secondary | ICD-10-CM

## 2014-09-24 DIAGNOSIS — R0989 Other specified symptoms and signs involving the circulatory and respiratory systems: Secondary | ICD-10-CM | POA: Diagnosis not present

## 2014-09-24 MED ORDER — ALBUTEROL SULFATE HFA 108 (90 BASE) MCG/ACT IN AERS: 2.0000 | INHALATION_SPRAY | Freq: Four times a day (QID) | RESPIRATORY_TRACT | Status: DC | PRN

## 2014-09-24 MED ORDER — PREDNISONE 10 MG PO TABS: 30.0000 mg | ORAL_TABLET | Freq: Every day | ORAL | Status: DC

## 2014-09-24 MED ORDER — BENZONATATE 100 MG PO CAPS: 100.0000 mg | ORAL_CAPSULE | Freq: Three times a day (TID) | ORAL | Status: DC | PRN

## 2014-09-24 MED ORDER — ALBUTEROL SULFATE (2.5 MG/3ML) 0.083% IN NEBU
INHALATION_SOLUTION | RESPIRATORY_TRACT | Status: AC
  Filled 2014-09-24: qty 3

## 2014-09-24 MED ORDER — DOXYCYCLINE HYCLATE 100 MG PO CAPS: 100.0000 mg | ORAL_CAPSULE | Freq: Two times a day (BID) | ORAL | Status: DC

## 2014-09-24 MED ORDER — IPRATROPIUM BROMIDE 0.02 % IN SOLN
RESPIRATORY_TRACT | Status: AC
  Filled 2014-09-24: qty 2.5

## 2014-09-24 MED ORDER — IPRATROPIUM-ALBUTEROL 0.5-2.5 (3) MG/3ML IN SOLN
3.0000 mL | Freq: Once | RESPIRATORY_TRACT | Status: AC
  Administered 2014-09-24: 3 mL via RESPIRATORY_TRACT

## 2014-09-24 NOTE — Discharge Instructions (Signed)
Thank you for coming in today. °Call or go to the emergency room if you get worse, have trouble breathing, have chest pains, or palpitations.  ° °Acute Bronchitis °Bronchitis is inflammation of the airways that extend from the windpipe into the lungs (bronchi). The inflammation often causes mucus to develop. This leads to a cough, which is the most common symptom of bronchitis.  °In acute bronchitis, the condition usually develops suddenly and goes away over time, usually in a couple weeks. Smoking, allergies, and asthma can make bronchitis worse. Repeated episodes of bronchitis may cause further lung problems.  °CAUSES °Acute bronchitis is most often caused by the same virus that causes a cold. The virus can spread from person to person (contagious) through coughing, sneezing, and touching contaminated objects. °SIGNS AND SYMPTOMS  °· Cough.   °· Fever.   °· Coughing up mucus.   °· Body aches.   °· Chest congestion.   °· Chills.   °· Shortness of breath.   °· Sore throat.   °DIAGNOSIS  °Acute bronchitis is usually diagnosed through a physical exam. Your health care provider will also ask you questions about your medical history. Tests, such as chest X-rays, are sometimes done to rule out other conditions.  °TREATMENT  °Acute bronchitis usually goes away in a couple weeks. Oftentimes, no medical treatment is necessary. Medicines are sometimes given for relief of fever or cough. Antibiotic medicines are usually not needed but may be prescribed in certain situations. In some cases, an inhaler may be recommended to help reduce shortness of breath and control the cough. A cool mist vaporizer may also be used to help thin bronchial secretions and make it easier to clear the chest.  °HOME CARE INSTRUCTIONS °· Get plenty of rest.   °· Drink enough fluids to keep your urine clear or pale yellow (unless you have a medical condition that requires fluid restriction). Increasing fluids may help thin your respiratory secretions  (sputum) and reduce chest congestion, and it will prevent dehydration.   °· Take medicines only as directed by your health care provider. °· If you were prescribed an antibiotic medicine, finish it all even if you start to feel better. °· Avoid smoking and secondhand smoke. Exposure to cigarette smoke or irritating chemicals will make bronchitis worse. If you are a smoker, consider using nicotine gum or skin patches to help control withdrawal symptoms. Quitting smoking will help your lungs heal faster.   °· Reduce the chances of another bout of acute bronchitis by washing your hands frequently, avoiding people with cold symptoms, and trying not to touch your hands to your mouth, nose, or eyes.   °· Keep all follow-up visits as directed by your health care provider.   °SEEK MEDICAL CARE IF: °Your symptoms do not improve after 1 week of treatment.  °SEEK IMMEDIATE MEDICAL CARE IF: °· You develop an increased fever or chills.   °· You have chest pain.   °· You have severe shortness of breath. °· You have bloody sputum.   °· You develop dehydration. °· You faint or repeatedly feel like you are going to pass out. °· You develop repeated vomiting. °· You develop a severe headache. °MAKE SURE YOU:  °· Understand these instructions. °· Will watch your condition. °· Will get help right away if you are not doing well or get worse. °Document Released: 12/03/2004 Document Revised: 03/12/2014 Document Reviewed: 04/18/2013 °[employer]® Patient Information ©2015 [employer]. This information is not intended to replace advice given to you by your health care provider. Make sure you discuss any questions you have with your   health care provider. ° °

## 2014-09-24 NOTE — ED Provider Notes (Signed)
Chase [name] is a 68 y.o. male who presents to Urgent Care today for Cough. Patient notes cough congestion and runny nose. No shortness of breath but some wheezing present. No fevers or chills vomiting or diarrhea. Patient recently arrived from [place] where he was exposed to very poor air quality. No chest pains or palpitations.   Past Medical History  Diagnosis Date  . Mild hyperlipidemia   . Mononucleosis 1959  . Dizziness   . CAD (coronary artery disease)   . Wolf-Parkinson-White syndrome   . Rosacea   . BPH (benign prostatic hyperplasia)    Past Surgical History  Procedure Laterality Date  . Laminectomy  1976  . Nasal septum surgery  1983  . Vasectomy  1982  . Shoulder surgery  1990   History  Substance Use Topics  . Smoking status: Never Smoker   . Smokeless tobacco: Not on file  . Alcohol Use: Yes     Comment: socially   ROS as above Medications: No current facility-administered medications for this encounter.   Current Outpatient Prescriptions  Medication Sig Dispense Refill  . aspirin EC 81 MG tablet Take 81 mg by mouth daily.    . finasteride (PROSCAR) 5 MG tablet Take 5 mg by mouth daily.    . Multiple Vitamin (MULTI-VITAMIN PO) Take by mouth daily.      . Omega-3 Fatty Acids (FISH OIL PO) Take by mouth daily.      Marland Kitchen albuterol (PROVENTIL HFA;VENTOLIN HFA) 108 (90 BASE) MCG/ACT inhaler Inhale 2 puffs into the lungs every 6 (six) hours as needed for wheezing or shortness of breath. 1 Inhaler 2  . benzonatate (TESSALON) 100 MG capsule Take 1 capsule (100 mg total) by mouth 3 (three) times daily as needed for cough. 30 capsule 1  . doxycycline (VIBRAMYCIN) 100 MG capsule Take 1 capsule (100 mg total) by mouth 2 (two) times daily. 14 capsule 0  . predniSONE (DELTASONE) 10 MG tablet Take 3 tablets (30 mg total) by mouth daily. 15 tablet 0  . zolpidem (AMBIEN) 5 MG tablet Take 1 tablet (5 mg total) by mouth at bedtime as needed for sleep. 30 tablet 0   Allergies   Allergen Reactions  . Penicillins     as child; not certain of reaction     Exam:  BP 144/89 mmHg  Pulse 80  Temp(Src) 98.4 F (36.9 C) (Oral)  Resp 18  SpO2 100% Gen: Well NAD HEENT: EOMI,  MMM posterior pharynx with cobblestoning. Normal tympanic membranes bilaterally. Lungs: Normal work of breathing. Course breath sounds bilaterally Heart: RRR no MRG Abd: NABS, Soft. Nondistended, Nontender Exts: Brisk capillary refill, warm and well perfused.   Patient was given a DuoNeb nebulizer treatment ( 2.5/0.5 mg) and felt better  No results found for this or any previous visit (from the past 24 hour(s)). No results found.  Assessment and Plan: 68 y.o. male with bronchitis. Treatment with prednisone and albuterol doxycycline and Tessalon. Follow-up with PCP.  Discussed warning signs or symptoms. Please see discharge instructions. Patient expresses understanding.     Gregor Hams, MD 09/24/14 [phone]

## 2014-09-24 NOTE — ED Notes (Signed)
Returned from [place] on Saturday. 6-7 day duration of cough, congestion

## 2014-10-16 ENCOUNTER — Encounter: Payer: Medicare Other | Admitting: Internal Medicine

## 2014-10-23 DIAGNOSIS — J310 Chronic rhinitis: Secondary | ICD-10-CM | POA: Diagnosis not present

## 2014-10-23 DIAGNOSIS — R05 Cough: Secondary | ICD-10-CM | POA: Diagnosis not present

## 2014-11-20 ENCOUNTER — Encounter: Payer: Self-pay | Admitting: Internal Medicine

## 2014-11-20 ENCOUNTER — Ambulatory Visit (INDEPENDENT_AMBULATORY_CARE_PROVIDER_SITE_OTHER): Payer: Medicare Other | Admitting: Internal Medicine

## 2014-11-20 VITALS — BP 130/76 | HR 74 | Temp 98.2°F | Resp 10 | Ht 68.0 in | Wt 179.0 lb

## 2014-11-20 DIAGNOSIS — M4806 Spinal stenosis, lumbar region: Secondary | ICD-10-CM

## 2014-11-20 DIAGNOSIS — Z23 Encounter for immunization: Secondary | ICD-10-CM

## 2014-11-20 DIAGNOSIS — J309 Allergic rhinitis, unspecified: Secondary | ICD-10-CM | POA: Diagnosis not present

## 2014-11-20 DIAGNOSIS — M48061 Spinal stenosis, lumbar region without neurogenic claudication: Secondary | ICD-10-CM

## 2014-11-20 DIAGNOSIS — E785 Hyperlipidemia, unspecified: Secondary | ICD-10-CM

## 2014-11-20 DIAGNOSIS — Z0000 Encounter for general adult medical examination without abnormal findings: Secondary | ICD-10-CM

## 2014-11-20 DIAGNOSIS — I456 Pre-excitation syndrome: Secondary | ICD-10-CM

## 2014-11-20 NOTE — Progress Notes (Signed)
Passed clock drawing 

## 2014-11-20 NOTE — Progress Notes (Signed)
Patient ID: Chase [name], male   DOB: December 28, 1945, 69 y.o.   MRN: [id number]    Chief Complaint  Patient presents with  . Annual Exam    Yearly check-up, last labs 08/2014, MMSE, last EKG 2014    . MMSE    30/30, passed clock drawing    Allergies  Allergen Reactions  . Penicillins     as child; not certain of reaction   HPI 69 y/o male patient is here for yearly physical. He is overall a healthy elderly male patient. He has history of hyperlipidemia with LDL at goal on fish oil. Reviewed his medications. He has hx of WPW which has now resolved. He has chronic back issues and has undergone epidural injection for this. 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 He has not seen his neurosurgeon recently. He travelled to [place] last year and has some runny nose with clear PND on and off. He is currently taking claritin and flonase and finds them helpful. He has gained 10 lbs since last visit- mentions eating while travelling and during holiday season. He works on his treadmill for 30 min 3.5 mile 5 days a week.  Immunization History  Administered Date(s) Administered  . Influenza Nasal 08/16/2013  . Influenza-Unspecified 07/10/2014  . Pneumococcal Polysaccharide-23 09/11/2013  . Td 04/23/2012  . Zoster 07/10/2013    Review of Systems  Constitutional: Positive for weight gain. Negative for fever, chills, malaise/fatigue and diaphoresis.  HENT: Negative for congestion, ear discharge, ear pain, hearing loss, nosebleeds, sore throat and tinnitus. Using flonase at night Eyes: Negative for blurred vision, double vision, photophobia, discharge and redness. Wears corrective lenses and follows with [facility]. No glaucoma reported. Last seen 3-4 months back Respiratory: Negative for cough, hemoptysis, sputum production, shortness of breath, wheezing and stridor.     Cardiovascular: Negative for chest pain, palpitations, orthopnea, claudication, leg swelling and PND.  Gastrointestinal: Negative for heartburn, nausea, vomiting, abdominal pain, diarrhea, constipation, blood in stool and melena. Last colonoscopy in 10/21/10 with history of hyperplastic polyps in 2001 and 2006 but repeat in 2011 showed mild diverticulosis in sigmoid colon, diminutive polyp and repeat exam recommended in 10 years  Genitourinary: Negative for dysuria, urgency, frequency, hematuria and flank pain. Has to get up once sometimes to urinate. Finasteride is helpful Musculoskeletal: Negative for back pain, falls, joint pain, myalgias and neck pain. Dr Hal Neer neurosurgeon for his chronic back issues  Skin: Negative for itching and rash.  Neurological: Negative for dizziness, tingling, tremors, sensory change, speech change, focal weakness, seizures, loss of consciousness and headaches.  Endo/Heme/Allergies: Negative for polydipsia. Does not bruise/bleed easily.  Psychiatric/Behavioral: Negative for depression, suicidal ideas, hallucinations, memory loss and substance abuse. The patient is not nervous/anxious and does not have insomnia.    Past Medical History  Diagnosis Date  . Mild hyperlipidemia   . Mononucleosis 1959  . Dizziness   . CAD (coronary artery disease)   . Wolf-Parkinson-White syndrome   . Rosacea   . BPH (benign prostatic hyperplasia)    Past Surgical History  Procedure Laterality Date  . Laminectomy  1976  . Nasal septum surgery  1983  . Vasectomy  1982  . Shoulder surgery  1990   Family History  Problem Relation Age of Onset  . Cancer Father   . Prostate cancer Father   . Cancer Mother   . Heart disease Mother   . Cancer -  Cervical Mother    History   Social History  . Marital Status: Married    Spouse Name: N/A    Number of Children: N/A  . Years of Education: N/A   Occupational History  . Not on file.   Social History Main Topics  . Smoking  status: Never Smoker   . Smokeless tobacco: Not on file  . Alcohol Use: Yes     Comment: socially  . Drug Use: No  . Sexual Activity: Not on file   Other Topics Concern  . Not on file   Social History Narrative    Physical exam BP 130/76 mmHg  Pulse 74  Temp(Src) 98.2 F (36.8 C) (Oral)  Resp 10  Ht 5\' 8"  (1.727 m)  Wt 179 lb (81.194 kg)  BMI 27.22 kg/m2  SpO2 96%  Wt Readings from Last 3 Encounters:  11/20/14 179 lb (81.194 kg)  06/29/14 172 lb 6.4 oz (78.2 kg)  08/29/13 166 lb 12.8 oz (75.66 kg)   General- elderly male in no acute distress Head- atraumatic, normocephalic Eyes- PERRLA, EOMI, no pallor, no icterus Neck- no lymphadenopathy, no thyromegaly, no jugular vein distension, no carotid bruit Chest- no chest wall deformities, no chest wall tenderness Cardiovascular- normal s1,s2, no murmurs/ rubs/ gallops Respiratory- bilateral clear to auscultation, no wheeze, no rhonchi, no crackles Abdomen- bowel sounds present, soft, non tender, no organomegaly, no abdominal bruits, no guarding or rigidity, no CVA tenderness Musculoskeletal- able to move all 4 extremities, no spinal and paraspinal tenderness, steady gait, no use of assistive device. Has surgical scar in lower back Neurological- no focal deficit, normal reflexes, normal muscle strength, normal sensation to fine touch and vibration Psychiatry- alert and oriented to person, place and time, normal mood and affect  Labs  CBC Latest Ref Rng 08/27/2014 09/07/2013  WBC 3.4 - 10.8 x10E3/uL 3.9 4.2  Hemoglobin 12.6 - 17.7 g/dL 14.2 13.2  Hematocrit 37.5 - 51.0 % 44.7 40.6   CMP Latest Ref Rng 08/27/2014 09/07/2013 09/16/2011  Glucose 65 - 99 mg/dL 95 96 101(H)  BUN 8 - 27 mg/dL 16 13 13   Creatinine 0.76 - 1.27 mg/dL 1.05 0.97 1.0  Sodium 134 - 144 mmol/L 143 140 139  Potassium 3.5 - 5.2 mmol/L 5.0 4.9 5.1  Chloride 97 - 108 mmol/L 103 101 103  CO2 18 - 29 mmol/L 26 27 29   Calcium 8.6 - 10.2 mg/dL 9.2 9.3 9.3    Total Protein 6.0 - 8.5 g/dL 6.8 6.5 7.3  Albumin 3.6 - 4.8 g/dL 4.3 4.2 -  Total Bilirubin 0.0 - 1.2 mg/dL 0.3 0.4 0.9  Alkaline Phos 39 - 117 IU/L 62 56 59  AST 0 - 40 IU/L 31 24 31   ALT 0 - 44 IU/L 29 21 29    Lipid Panel     Component Value Date/Time   CHOL 199 09/16/2011 0831   TRIG 47 08/27/2014 1242   HDL 73 08/27/2014 1242   HDL 76.70 09/16/2011 0831   CHOLHDL 2.6 08/27/2014 1242   CHOLHDL 3 09/16/2011 0831   VLDL 13.0 09/16/2011 0831   LDLCALC 105* 08/27/2014 1242   LDLCALC 109* 09/16/2011 0831   Lab Results  Component Value Date   PSA 1.3 08/27/2014   PSA 2.64 09/16/2011   11/20/14 MMSE 30/30, passed clock draw   Assessment/plan  1. Routine general medical examination at a health care facility Provide prevnar 13, uptodate with other immunization. Reviewed labs with patient. the patient was counseled regarding the appropriate use of  alcohol, prevention of dental and periodontal disease, diet, regular sustained exercise for at least 30 minutes 5 times per week, the proper use of sunscreen and protective clothing, tobacco use, and recommended cholesterol, thyroid and diabetes screening. - CMP; Future - Lipid Panel; Future - CBC with Differential; Future - TSH; Future - Pneumococcal conjugate vaccine 13-valent  2. Spinal stenosis, lumbar region, without neurogenic claudication Pain under control. Able to exercise. No falls. Monitor clinically - CMP; Future - Lipid Panel; Future  3. Hyperlipidemia LDL goal <130 ldl at goal, continue aspirin and fish oil - CMP; Future - Lipid Panel; Future - CBC with Differential; Future - TSH; Future  4. Wolff-Parkinson-White syndrome Stable.  - TSH; Future  5. Allergic rhinitis, unspecified allergic rhinitis type Continue flonase and zyrtec for now  6. Need for vaccination with 13-polyvalent pneumococcal conjugate vaccine - Pneumococcal conjugate vaccine 13-valent

## 2015-01-01 ENCOUNTER — Encounter: Payer: Self-pay | Admitting: Internal Medicine

## 2015-04-19 DIAGNOSIS — N138 Other obstructive and reflux uropathy: Secondary | ICD-10-CM | POA: Diagnosis not present

## 2015-04-19 DIAGNOSIS — N401 Enlarged prostate with lower urinary tract symptoms: Secondary | ICD-10-CM | POA: Diagnosis not present

## 2015-08-05 DIAGNOSIS — Z6827 Body mass index (BMI) 27.0-27.9, adult: Secondary | ICD-10-CM | POA: Diagnosis not present

## 2015-08-05 DIAGNOSIS — N400 Enlarged prostate without lower urinary tract symptoms: Secondary | ICD-10-CM | POA: Diagnosis not present

## 2015-08-05 DIAGNOSIS — Z1389 Encounter for screening for other disorder: Secondary | ICD-10-CM | POA: Diagnosis not present

## 2015-08-05 DIAGNOSIS — I456 Pre-excitation syndrome: Secondary | ICD-10-CM | POA: Diagnosis not present

## 2015-08-13 DIAGNOSIS — Z23 Encounter for immunization: Secondary | ICD-10-CM | POA: Diagnosis not present

## 2015-08-15 DIAGNOSIS — H01001 Unspecified blepharitis right upper eyelid: Secondary | ICD-10-CM | POA: Diagnosis not present

## 2015-08-15 DIAGNOSIS — H01004 Unspecified blepharitis left upper eyelid: Secondary | ICD-10-CM | POA: Diagnosis not present

## 2015-08-15 DIAGNOSIS — Z0100 Encounter for examination of eyes and vision without abnormal findings: Secondary | ICD-10-CM | POA: Diagnosis not present

## 2015-09-24 DIAGNOSIS — L918 Other hypertrophic disorders of the skin: Secondary | ICD-10-CM | POA: Diagnosis not present

## 2015-11-21 ENCOUNTER — Other Ambulatory Visit: Payer: Medicare Other

## 2015-11-26 ENCOUNTER — Encounter: Payer: Medicare Other | Admitting: Internal Medicine

## 2015-11-26 DIAGNOSIS — R8290 Unspecified abnormal findings in urine: Secondary | ICD-10-CM | POA: Diagnosis not present

## 2015-11-26 DIAGNOSIS — Z125 Encounter for screening for malignant neoplasm of prostate: Secondary | ICD-10-CM | POA: Diagnosis not present

## 2015-11-26 DIAGNOSIS — Z0000 Encounter for general adult medical examination without abnormal findings: Secondary | ICD-10-CM | POA: Diagnosis not present

## 2015-11-26 DIAGNOSIS — I456 Pre-excitation syndrome: Secondary | ICD-10-CM | POA: Diagnosis not present

## 2015-11-27 ENCOUNTER — Encounter: Payer: Medicare Other | Admitting: Internal Medicine

## 2015-12-06 DIAGNOSIS — N400 Enlarged prostate without lower urinary tract symptoms: Secondary | ICD-10-CM | POA: Diagnosis not present

## 2015-12-06 DIAGNOSIS — I456 Pre-excitation syndrome: Secondary | ICD-10-CM | POA: Diagnosis not present

## 2015-12-06 DIAGNOSIS — Z0000 Encounter for general adult medical examination without abnormal findings: Secondary | ICD-10-CM | POA: Diagnosis not present

## 2015-12-06 DIAGNOSIS — Z6827 Body mass index (BMI) 27.0-27.9, adult: Secondary | ICD-10-CM | POA: Diagnosis not present

## 2016-07-06 DIAGNOSIS — H10413 Chronic giant papillary conjunctivitis, bilateral: Secondary | ICD-10-CM | POA: Diagnosis not present

## 2016-08-05 DIAGNOSIS — Z23 Encounter for immunization: Secondary | ICD-10-CM | POA: Diagnosis not present

## 2016-08-24 DIAGNOSIS — H524 Presbyopia: Secondary | ICD-10-CM | POA: Diagnosis not present

## 2016-08-24 DIAGNOSIS — H01001 Unspecified blepharitis right upper eyelid: Secondary | ICD-10-CM | POA: Diagnosis not present

## 2016-08-24 DIAGNOSIS — H01004 Unspecified blepharitis left upper eyelid: Secondary | ICD-10-CM | POA: Diagnosis not present

## 2016-08-24 DIAGNOSIS — H2513 Age-related nuclear cataract, bilateral: Secondary | ICD-10-CM | POA: Diagnosis not present

## 2016-12-02 DIAGNOSIS — Z125 Encounter for screening for malignant neoplasm of prostate: Secondary | ICD-10-CM | POA: Diagnosis not present

## 2016-12-02 DIAGNOSIS — Z0000 Encounter for general adult medical examination without abnormal findings: Secondary | ICD-10-CM | POA: Diagnosis not present

## 2016-12-02 DIAGNOSIS — R8290 Unspecified abnormal findings in urine: Secondary | ICD-10-CM | POA: Diagnosis not present

## 2016-12-02 DIAGNOSIS — R7989 Other specified abnormal findings of blood chemistry: Secondary | ICD-10-CM | POA: Diagnosis not present

## 2016-12-18 DIAGNOSIS — Z1212 Encounter for screening for malignant neoplasm of rectum: Secondary | ICD-10-CM | POA: Diagnosis not present

## 2016-12-31 DIAGNOSIS — E663 Overweight: Secondary | ICD-10-CM | POA: Diagnosis not present

## 2016-12-31 DIAGNOSIS — N400 Enlarged prostate without lower urinary tract symptoms: Secondary | ICD-10-CM | POA: Diagnosis not present

## 2016-12-31 DIAGNOSIS — Z125 Encounter for screening for malignant neoplasm of prostate: Secondary | ICD-10-CM | POA: Diagnosis not present

## 2016-12-31 DIAGNOSIS — Z6828 Body mass index (BMI) 28.0-28.9, adult: Secondary | ICD-10-CM | POA: Diagnosis not present

## 2016-12-31 DIAGNOSIS — I456 Pre-excitation syndrome: Secondary | ICD-10-CM | POA: Diagnosis not present

## 2016-12-31 DIAGNOSIS — Z0000 Encounter for general adult medical examination without abnormal findings: Secondary | ICD-10-CM | POA: Diagnosis not present

## 2016-12-31 DIAGNOSIS — G4709 Other insomnia: Secondary | ICD-10-CM | POA: Diagnosis not present

## 2016-12-31 DIAGNOSIS — Z1389 Encounter for screening for other disorder: Secondary | ICD-10-CM | POA: Diagnosis not present

## 2016-12-31 DIAGNOSIS — L719 Rosacea, unspecified: Secondary | ICD-10-CM | POA: Diagnosis not present

## 2017-07-19 DIAGNOSIS — R350 Frequency of micturition: Secondary | ICD-10-CM | POA: Diagnosis not present

## 2017-07-19 DIAGNOSIS — N401 Enlarged prostate with lower urinary tract symptoms: Secondary | ICD-10-CM | POA: Diagnosis not present

## 2017-08-06 DIAGNOSIS — Z23 Encounter for immunization: Secondary | ICD-10-CM | POA: Diagnosis not present

## 2017-08-25 DIAGNOSIS — H5213 Myopia, bilateral: Secondary | ICD-10-CM | POA: Diagnosis not present

## 2017-08-25 DIAGNOSIS — H2513 Age-related nuclear cataract, bilateral: Secondary | ICD-10-CM | POA: Diagnosis not present

## 2017-12-03 DIAGNOSIS — Z6827 Body mass index (BMI) 27.0-27.9, adult: Secondary | ICD-10-CM | POA: Diagnosis not present

## 2017-12-03 DIAGNOSIS — R030 Elevated blood-pressure reading, without diagnosis of hypertension: Secondary | ICD-10-CM | POA: Diagnosis not present

## 2017-12-03 DIAGNOSIS — M5442 Lumbago with sciatica, left side: Secondary | ICD-10-CM | POA: Diagnosis not present

## 2017-12-29 DIAGNOSIS — R82998 Other abnormal findings in urine: Secondary | ICD-10-CM | POA: Diagnosis not present

## 2017-12-29 DIAGNOSIS — R7989 Other specified abnormal findings of blood chemistry: Secondary | ICD-10-CM | POA: Diagnosis not present

## 2017-12-29 DIAGNOSIS — Z125 Encounter for screening for malignant neoplasm of prostate: Secondary | ICD-10-CM | POA: Diagnosis not present

## 2017-12-29 DIAGNOSIS — Z0000 Encounter for general adult medical examination without abnormal findings: Secondary | ICD-10-CM | POA: Diagnosis not present

## 2018-01-03 DIAGNOSIS — Z1389 Encounter for screening for other disorder: Secondary | ICD-10-CM | POA: Diagnosis not present

## 2018-01-03 DIAGNOSIS — K429 Umbilical hernia without obstruction or gangrene: Secondary | ICD-10-CM | POA: Diagnosis not present

## 2018-01-03 DIAGNOSIS — I456 Pre-excitation syndrome: Secondary | ICD-10-CM | POA: Diagnosis not present

## 2018-01-03 DIAGNOSIS — R413 Other amnesia: Secondary | ICD-10-CM | POA: Diagnosis not present

## 2018-01-03 DIAGNOSIS — L719 Rosacea, unspecified: Secondary | ICD-10-CM | POA: Diagnosis not present

## 2018-01-03 DIAGNOSIS — G4700 Insomnia, unspecified: Secondary | ICD-10-CM | POA: Diagnosis not present

## 2018-01-03 DIAGNOSIS — M4800 Spinal stenosis, site unspecified: Secondary | ICD-10-CM | POA: Diagnosis not present

## 2018-01-03 DIAGNOSIS — Z6827 Body mass index (BMI) 27.0-27.9, adult: Secondary | ICD-10-CM | POA: Diagnosis not present

## 2018-01-03 DIAGNOSIS — N400 Enlarged prostate without lower urinary tract symptoms: Secondary | ICD-10-CM | POA: Diagnosis not present

## 2018-01-03 DIAGNOSIS — Z0000 Encounter for general adult medical examination without abnormal findings: Secondary | ICD-10-CM | POA: Diagnosis not present

## 2018-01-03 DIAGNOSIS — E663 Overweight: Secondary | ICD-10-CM | POA: Diagnosis not present

## 2018-01-06 DIAGNOSIS — Z1212 Encounter for screening for malignant neoplasm of rectum: Secondary | ICD-10-CM | POA: Diagnosis not present

## 2018-01-24 DIAGNOSIS — M545 Low back pain: Secondary | ICD-10-CM | POA: Diagnosis not present

## 2018-02-09 DIAGNOSIS — M545 Low back pain: Secondary | ICD-10-CM | POA: Diagnosis not present

## 2018-06-22 ENCOUNTER — Encounter: Payer: Self-pay | Admitting: Internal Medicine

## 2018-08-05 DIAGNOSIS — Z23 Encounter for immunization: Secondary | ICD-10-CM | POA: Diagnosis not present

## 2018-08-08 ENCOUNTER — Ambulatory Visit (INDEPENDENT_AMBULATORY_CARE_PROVIDER_SITE_OTHER): Payer: Medicare Other

## 2018-08-08 ENCOUNTER — Other Ambulatory Visit: Payer: Self-pay | Admitting: Podiatry

## 2018-08-08 ENCOUNTER — Ambulatory Visit (INDEPENDENT_AMBULATORY_CARE_PROVIDER_SITE_OTHER): Payer: Medicare Other | Admitting: Podiatry

## 2018-08-08 ENCOUNTER — Encounter: Payer: Self-pay | Admitting: Podiatry

## 2018-08-08 VITALS — BP 130/62

## 2018-08-08 DIAGNOSIS — M79671 Pain in right foot: Secondary | ICD-10-CM

## 2018-08-08 DIAGNOSIS — B079 Viral wart, unspecified: Secondary | ICD-10-CM

## 2018-08-08 DIAGNOSIS — M2042 Other hammer toe(s) (acquired), left foot: Secondary | ICD-10-CM | POA: Diagnosis not present

## 2018-08-08 DIAGNOSIS — M779 Enthesopathy, unspecified: Secondary | ICD-10-CM

## 2018-08-08 DIAGNOSIS — M79672 Pain in left foot: Secondary | ICD-10-CM

## 2018-08-08 MED ORDER — TRIAMCINOLONE ACETONIDE 10 MG/ML IJ SUSP
10.0000 mg | Freq: Once | INTRAMUSCULAR | Status: AC
  Administered 2018-08-08: 10 mg

## 2018-08-08 NOTE — Progress Notes (Signed)
dg 

## 2018-08-08 NOTE — Progress Notes (Signed)
Subjective:   Patient ID: Chase [name], male   DOB: 72 y.o.   MRN: [id number]   HPI Patient presents with quite a bit of discomfort in the left fifth digit stating that he wore a new shoe and it became inflamed and it hard to wear certain shoes and on the right Achilles tendon there is a small lesion that he trim himself and it bled and it can become aggravating.  Patient does not smoke likes to be active   Review of Systems  All other systems reviewed and are negative.       Objective:  Physical Exam  Constitutional: He appears well-developed and well-nourished.  Cardiovascular: Intact distal pulses.  Pulmonary/Chest: Effort normal.  Musculoskeletal: Normal range of motion.  Neurological: He is alert.  Skin: Skin is warm.  Nursing note and vitals reviewed.   Neurovascular status intact muscle strength is adequate range of motion within normal limits with patient noted to have inflammation of the fifth digit left with fluid buildup in her phalangeal joint corn formation and on the right Achilles there is a small lesion measuring 4 x 4 mm that does have pinpoint bleeding upon debridement and is painful to lateral pressure.  Good digital perfusion noted     Assessment:  Hammertoe deformity fifth digit left with inflammation of the inner phalangeal joint and probable verruca plantaris right Achilles tendon     Plan:  H&P both conditions discussed and for the left fifth digit I went ahead and I infiltrated the toe 60 mill grams like Marcaine mixture I did inner phalangeal joint injection of the capsule 2 mg dexamethasone Kenalog and debrided the lesion along with padding.  For the right I debrided applied agent to create immune response and explained what to do if any blistering were to occur and patient will be seen back as needed  X-ray left indicated that there is mild rotation of the toe but no other pathology

## 2018-09-29 DIAGNOSIS — H2513 Age-related nuclear cataract, bilateral: Secondary | ICD-10-CM | POA: Diagnosis not present

## 2018-09-29 DIAGNOSIS — R3121 Asymptomatic microscopic hematuria: Secondary | ICD-10-CM | POA: Diagnosis not present

## 2018-09-29 DIAGNOSIS — H5213 Myopia, bilateral: Secondary | ICD-10-CM | POA: Diagnosis not present

## 2018-10-05 DIAGNOSIS — R3121 Asymptomatic microscopic hematuria: Secondary | ICD-10-CM | POA: Diagnosis not present

## 2018-10-05 DIAGNOSIS — N289 Disorder of kidney and ureter, unspecified: Secondary | ICD-10-CM | POA: Diagnosis not present

## 2018-10-18 DIAGNOSIS — R0989 Other specified symptoms and signs involving the circulatory and respiratory systems: Secondary | ICD-10-CM | POA: Diagnosis not present

## 2018-10-18 DIAGNOSIS — J302 Other seasonal allergic rhinitis: Secondary | ICD-10-CM | POA: Diagnosis not present

## 2018-10-18 DIAGNOSIS — J310 Chronic rhinitis: Secondary | ICD-10-CM | POA: Diagnosis not present

## 2018-10-18 DIAGNOSIS — K219 Gastro-esophageal reflux disease without esophagitis: Secondary | ICD-10-CM | POA: Diagnosis not present

## 2018-11-14 DIAGNOSIS — R350 Frequency of micturition: Secondary | ICD-10-CM | POA: Diagnosis not present

## 2018-11-14 DIAGNOSIS — N401 Enlarged prostate with lower urinary tract symptoms: Secondary | ICD-10-CM | POA: Diagnosis not present

## 2018-11-14 DIAGNOSIS — R3121 Asymptomatic microscopic hematuria: Secondary | ICD-10-CM | POA: Diagnosis not present

## 2019-01-02 DIAGNOSIS — R82998 Other abnormal findings in urine: Secondary | ICD-10-CM | POA: Diagnosis not present

## 2019-01-09 DIAGNOSIS — R413 Other amnesia: Secondary | ICD-10-CM | POA: Diagnosis not present

## 2019-01-09 DIAGNOSIS — M4800 Spinal stenosis, site unspecified: Secondary | ICD-10-CM | POA: Diagnosis not present

## 2019-01-09 DIAGNOSIS — K429 Umbilical hernia without obstruction or gangrene: Secondary | ICD-10-CM | POA: Diagnosis not present

## 2019-01-09 DIAGNOSIS — L719 Rosacea, unspecified: Secondary | ICD-10-CM | POA: Diagnosis not present

## 2019-01-09 DIAGNOSIS — Z0000 Encounter for general adult medical examination without abnormal findings: Secondary | ICD-10-CM | POA: Diagnosis not present

## 2019-01-09 DIAGNOSIS — E663 Overweight: Secondary | ICD-10-CM | POA: Diagnosis not present

## 2019-01-09 DIAGNOSIS — Z1339 Encounter for screening examination for other mental health and behavioral disorders: Secondary | ICD-10-CM | POA: Diagnosis not present

## 2019-01-09 DIAGNOSIS — Z1331 Encounter for screening for depression: Secondary | ICD-10-CM | POA: Diagnosis not present

## 2019-01-09 DIAGNOSIS — G4700 Insomnia, unspecified: Secondary | ICD-10-CM | POA: Diagnosis not present

## 2019-01-09 DIAGNOSIS — N400 Enlarged prostate without lower urinary tract symptoms: Secondary | ICD-10-CM | POA: Diagnosis not present

## 2019-01-09 DIAGNOSIS — I456 Pre-excitation syndrome: Secondary | ICD-10-CM | POA: Diagnosis not present

## 2019-08-07 DIAGNOSIS — D1800 Hemangioma unspecified site: Secondary | ICD-10-CM | POA: Diagnosis not present

## 2019-08-07 DIAGNOSIS — J309 Allergic rhinitis, unspecified: Secondary | ICD-10-CM | POA: Diagnosis not present

## 2019-09-05 DIAGNOSIS — Z23 Encounter for immunization: Secondary | ICD-10-CM | POA: Diagnosis not present

## 2019-10-12 DIAGNOSIS — H5213 Myopia, bilateral: Secondary | ICD-10-CM | POA: Diagnosis not present

## 2019-10-12 DIAGNOSIS — H2513 Age-related nuclear cataract, bilateral: Secondary | ICD-10-CM | POA: Diagnosis not present

## 2019-10-12 DIAGNOSIS — H524 Presbyopia: Secondary | ICD-10-CM | POA: Diagnosis not present

## 2020-01-08 DIAGNOSIS — R7989 Other specified abnormal findings of blood chemistry: Secondary | ICD-10-CM | POA: Diagnosis not present

## 2020-01-08 DIAGNOSIS — Z0000 Encounter for general adult medical examination without abnormal findings: Secondary | ICD-10-CM | POA: Diagnosis not present

## 2020-01-08 DIAGNOSIS — Z125 Encounter for screening for malignant neoplasm of prostate: Secondary | ICD-10-CM | POA: Diagnosis not present

## 2020-01-08 DIAGNOSIS — Z79899 Other long term (current) drug therapy: Secondary | ICD-10-CM | POA: Diagnosis not present

## 2020-01-09 DIAGNOSIS — R82998 Other abnormal findings in urine: Secondary | ICD-10-CM | POA: Diagnosis not present

## 2020-01-15 DIAGNOSIS — Z1331 Encounter for screening for depression: Secondary | ICD-10-CM | POA: Diagnosis not present

## 2020-01-15 DIAGNOSIS — K429 Umbilical hernia without obstruction or gangrene: Secondary | ICD-10-CM | POA: Diagnosis not present

## 2020-01-15 DIAGNOSIS — Z0000 Encounter for general adult medical examination without abnormal findings: Secondary | ICD-10-CM | POA: Diagnosis not present

## 2020-01-15 DIAGNOSIS — N400 Enlarged prostate without lower urinary tract symptoms: Secondary | ICD-10-CM | POA: Diagnosis not present

## 2020-01-15 DIAGNOSIS — M4800 Spinal stenosis, site unspecified: Secondary | ICD-10-CM | POA: Diagnosis not present

## 2020-01-15 DIAGNOSIS — Z1339 Encounter for screening examination for other mental health and behavioral disorders: Secondary | ICD-10-CM | POA: Diagnosis not present

## 2020-01-15 DIAGNOSIS — I456 Pre-excitation syndrome: Secondary | ICD-10-CM | POA: Diagnosis not present

## 2020-01-15 DIAGNOSIS — R413 Other amnesia: Secondary | ICD-10-CM | POA: Diagnosis not present

## 2020-01-15 DIAGNOSIS — J309 Allergic rhinitis, unspecified: Secondary | ICD-10-CM | POA: Diagnosis not present

## 2020-01-15 DIAGNOSIS — E663 Overweight: Secondary | ICD-10-CM | POA: Diagnosis not present

## 2020-01-15 DIAGNOSIS — G4700 Insomnia, unspecified: Secondary | ICD-10-CM | POA: Diagnosis not present

## 2020-01-30 DIAGNOSIS — Z1212 Encounter for screening for malignant neoplasm of rectum: Secondary | ICD-10-CM | POA: Diagnosis not present

## 2020-03-14 DIAGNOSIS — M25511 Pain in right shoulder: Secondary | ICD-10-CM | POA: Diagnosis not present

## 2020-03-14 DIAGNOSIS — E663 Overweight: Secondary | ICD-10-CM | POA: Diagnosis not present

## 2020-03-25 DIAGNOSIS — H43811 Vitreous degeneration, right eye: Secondary | ICD-10-CM | POA: Diagnosis not present

## 2020-03-25 DIAGNOSIS — H4311 Vitreous hemorrhage, right eye: Secondary | ICD-10-CM | POA: Diagnosis not present

## 2020-07-12 DIAGNOSIS — Z23 Encounter for immunization: Secondary | ICD-10-CM | POA: Diagnosis not present

## 2020-08-26 DIAGNOSIS — S80861A Insect bite (nonvenomous), right lower leg, initial encounter: Secondary | ICD-10-CM | POA: Diagnosis not present

## 2020-08-26 DIAGNOSIS — L03115 Cellulitis of right lower limb: Secondary | ICD-10-CM | POA: Diagnosis not present

## 2020-08-26 DIAGNOSIS — W57XXXA Bitten or stung by nonvenomous insect and other nonvenomous arthropods, initial encounter: Secondary | ICD-10-CM | POA: Diagnosis not present

## 2020-10-14 DIAGNOSIS — H2513 Age-related nuclear cataract, bilateral: Secondary | ICD-10-CM | POA: Diagnosis not present

## 2020-10-14 DIAGNOSIS — H43811 Vitreous degeneration, right eye: Secondary | ICD-10-CM | POA: Diagnosis not present

## 2020-10-14 DIAGNOSIS — H524 Presbyopia: Secondary | ICD-10-CM | POA: Diagnosis not present

## 2020-12-27 ENCOUNTER — Encounter: Payer: Self-pay | Admitting: Gastroenterology

## 2021-01-08 DIAGNOSIS — Z125 Encounter for screening for malignant neoplasm of prostate: Secondary | ICD-10-CM | POA: Diagnosis not present

## 2021-01-08 DIAGNOSIS — R7989 Other specified abnormal findings of blood chemistry: Secondary | ICD-10-CM | POA: Diagnosis not present

## 2021-01-08 DIAGNOSIS — E663 Overweight: Secondary | ICD-10-CM | POA: Diagnosis not present

## 2021-01-08 DIAGNOSIS — R413 Other amnesia: Secondary | ICD-10-CM | POA: Diagnosis not present

## 2021-01-15 DIAGNOSIS — R82998 Other abnormal findings in urine: Secondary | ICD-10-CM | POA: Diagnosis not present

## 2021-01-15 DIAGNOSIS — Z1212 Encounter for screening for malignant neoplasm of rectum: Secondary | ICD-10-CM | POA: Diagnosis not present

## 2021-02-17 DIAGNOSIS — Z1331 Encounter for screening for depression: Secondary | ICD-10-CM | POA: Diagnosis not present

## 2021-02-17 DIAGNOSIS — Z0000 Encounter for general adult medical examination without abnormal findings: Secondary | ICD-10-CM | POA: Diagnosis not present

## 2021-02-17 DIAGNOSIS — E663 Overweight: Secondary | ICD-10-CM | POA: Diagnosis not present

## 2021-02-17 DIAGNOSIS — R413 Other amnesia: Secondary | ICD-10-CM | POA: Diagnosis not present

## 2021-02-17 DIAGNOSIS — N400 Enlarged prostate without lower urinary tract symptoms: Secondary | ICD-10-CM | POA: Diagnosis not present

## 2021-02-17 DIAGNOSIS — G4700 Insomnia, unspecified: Secondary | ICD-10-CM | POA: Diagnosis not present

## 2021-02-17 DIAGNOSIS — Z1339 Encounter for screening examination for other mental health and behavioral disorders: Secondary | ICD-10-CM | POA: Diagnosis not present

## 2021-05-09 DIAGNOSIS — M5106 Intervertebral disc disorders with myelopathy, lumbar region: Secondary | ICD-10-CM | POA: Diagnosis not present

## 2021-05-13 ENCOUNTER — Ambulatory Visit (AMBULATORY_SURGERY_CENTER): Payer: Medicare Other

## 2021-05-13 ENCOUNTER — Other Ambulatory Visit: Payer: Self-pay

## 2021-05-13 VITALS — Ht 68.0 in | Wt 181.0 lb

## 2021-05-13 DIAGNOSIS — M545 Low back pain, unspecified: Secondary | ICD-10-CM | POA: Diagnosis not present

## 2021-05-13 DIAGNOSIS — Z1211 Encounter for screening for malignant neoplasm of colon: Secondary | ICD-10-CM

## 2021-05-13 MED ORDER — PEG 3350-KCL-NA BICARB-NACL 420 G PO SOLR: 4000.0000 mL | Freq: Once | ORAL | 0 refills | Status: AC

## 2021-05-13 NOTE — Progress Notes (Signed)

## 2021-05-16 DIAGNOSIS — M5116 Intervertebral disc disorders with radiculopathy, lumbar region: Secondary | ICD-10-CM | POA: Diagnosis not present

## 2021-05-16 DIAGNOSIS — M48061 Spinal stenosis, lumbar region without neurogenic claudication: Secondary | ICD-10-CM | POA: Diagnosis not present

## 2021-05-16 DIAGNOSIS — R29898 Other symptoms and signs involving the musculoskeletal system: Secondary | ICD-10-CM | POA: Diagnosis not present

## 2021-05-20 ENCOUNTER — Other Ambulatory Visit: Payer: Self-pay | Admitting: Neurosurgery

## 2021-05-20 NOTE — Pre-Procedure Instructions (Signed)
Surgical Instructions    Your procedure is scheduled on Thursday July 14th .  Report to [facility] Main Entrance "A" at 2:00 P.M., then check in with the Admitting office.  Call this number if you have problems the morning of surgery:  [phone]   If you have any questions prior to your surgery date call [phone]: Open Monday-Friday 8am-4pm    Remember:  Do not eat or drink after midnight the night before your surgery     Take these medicines the morning of surgery with A SIP OF WATER   cetirizine (ZYRTEC)  finasteride (PROSCAR)  fluticasone (FLONASE)     As of today, STOP taking any Aspirin (unless otherwise instructed by your surgeon) Aleve, Naproxen, Ibuprofen, Motrin, Advil, Goody's, BC's, all herbal medications, fish oil, and all vitamins.                     Do NOT Smoke (Tobacco/Vaping) or drink Alcohol 24 hours prior to your procedure.  If you use a CPAP at night, you may bring all equipment for your overnight stay.   Contacts, glasses, piercing's, hearing aid's, dentures or partials may not be worn into surgery, please bring cases for these belongings.    For patients admitted to the hospital, discharge time will be determined by your treatment team.   Patients discharged the day of surgery will not be allowed to drive home, and someone needs to stay with them for 24 hours.  ONLY 1 SUPPORT PERSON MAY BE PRESENT WHILE YOU ARE IN SURGERY. IF YOU ARE TO BE ADMITTED ONCE YOU ARE IN YOUR ROOM YOU WILL BE ALLOWED TWO (2) VISITORS.  Minor children may have two parents present. Special consideration for safety and communication needs will be reviewed on a case by case basis.   Special instructions:   [place]- Preparing For Surgery  Before surgery, you can play an important role. Because skin is not sterile, your skin needs to be as free of germs as possible. You can reduce the number of germs on your skin by washing with CHG (chlorahexidine gluconate) Soap before  surgery.  CHG is an antiseptic cleaner which kills germs and bonds with the skin to continue killing germs even after washing.    Oral Hygiene is also important to reduce your risk of infection.  Remember - BRUSH YOUR TEETH THE MORNING OF SURGERY WITH YOUR REGULAR TOOTHPASTE  Please do not use if you have an allergy to CHG or antibacterial soaps. If your skin becomes reddened/irritated stop using the CHG.  Do not shave (including legs and underarms) for at least 48 hours prior to first CHG shower. It is OK to shave your face.  Please follow these instructions carefully.   Shower the NIGHT BEFORE SURGERY and the MORNING OF SURGERY  If you chose to wash your hair, wash your hair first as usual with your normal shampoo.  After you shampoo, rinse your hair and body thoroughly to remove the shampoo.  Use CHG Soap as you would any other liquid soap. You can apply CHG directly to the skin and wash gently with a scrungie or a clean washcloth.   Apply the CHG Soap to your body ONLY FROM THE NECK DOWN.  Do not use on open wounds or open sores. Avoid contact with your eyes, ears, mouth and genitals (private parts). Wash Face and genitals (private parts)  with your normal soap.   Wash thoroughly, paying special attention to the area where your  surgery will be performed.  Thoroughly rinse your body with warm water from the neck down.  DO NOT shower/wash with your normal soap after using and rinsing off the CHG Soap.  Pat yourself dry with a CLEAN TOWEL.  Wear CLEAN PAJAMAS to bed the night before surgery  Place CLEAN SHEETS on your bed the night before your surgery  DO NOT SLEEP WITH PETS.   Day of Surgery: Shower with CHG soap. Do not wear jewelry, make up, nail polish, gel polish, artificial nails, or any other type of covering on natural nails including finger and toenails. If patients have artificial nails, gel coating, etc. that need to be removed by a nail salon please have this removed  prior to surgery. Surgery may need to be canceled/delayed if the surgeon/ anesthesia feels like the patient is unable to be adequately monitored. Do not wear lotions, powders, perfumes/colognes, or deodorant. Do not shave 48 hours prior to surgery.  Men may shave face and neck. Do not bring valuables to the hospital. [facility] is not responsible for any belongings or valuables. Wear Clean/Comfortable clothing the morning of surgery Remember to brush your teeth WITH YOUR REGULAR TOOTHPASTE.   Please read over the following fact sheets that you were given.

## 2021-05-21 ENCOUNTER — Other Ambulatory Visit: Payer: Self-pay

## 2021-05-21 ENCOUNTER — Encounter (HOSPITAL_COMMUNITY): Payer: Self-pay

## 2021-05-21 ENCOUNTER — Encounter (HOSPITAL_COMMUNITY)
Admission: RE | Admit: 2021-05-21 | Discharge: 2021-05-21 | Disposition: A | Discharge status: Home / Self Care | Payer: Medicare Other | Source: Ambulatory Visit | Attending: Neurosurgery | Admitting: Neurosurgery

## 2021-05-21 DIAGNOSIS — Z20822 Contact with and (suspected) exposure to covid-19: Secondary | ICD-10-CM | POA: Diagnosis not present

## 2021-05-21 DIAGNOSIS — Z01818 Encounter for other preprocedural examination: Secondary | ICD-10-CM | POA: Insufficient documentation

## 2021-05-21 HISTORY — DX: Unspecified osteoarthritis, unspecified site: M19.90

## 2021-05-21 LAB — BASIC METABOLIC PANEL
Anion gap: 5 (ref 5–15)
BUN: 16 mg/dL (ref 8–23)
CO2: 29 mmol/L (ref 22–32)
Calcium: 9.6 mg/dL (ref 8.9–10.3)
Chloride: 103 mmol/L (ref 98–111)
Creatinine, Ser: 0.93 mg/dL (ref 0.61–1.24)
GFR, Estimated: >60 mL/min (ref >60)
Glucose, Bld: 99 mg/dL (ref 70–99)
Potassium: 4.6 mmol/L (ref 3.5–5.1)
Sodium: 137 mmol/L (ref 135–145)

## 2021-05-21 LAB — CBC
HCT: 46.1 % (ref 39.0–52.0)
Hemoglobin: 14.8 g/dL (ref 13.0–17.0)
MCH: 30.5 pg (ref 26.0–34.0)
MCHC: 32.1 g/dL (ref 30.0–36.0)
MCV: 95.1 fL (ref 80.0–100.0)
Platelets: 293 10*3/uL (ref 150–400)
RBC: 4.85 MIL/uL (ref 4.22–5.81)
RDW: 12.4 % (ref 11.5–15.5)
WBC: 5.6 10*3/uL (ref 4.0–10.5)
nRBC: 0 % (ref 0.0–0.2)

## 2021-05-21 LAB — SARS CORONAVIRUS 2 (TAT 6-24 HRS): SARS Coronavirus 2: NEGATIVE

## 2021-05-21 LAB — SURGICAL PCR SCREEN
MRSA, PCR: NEGATIVE
Staphylococcus aureus: NEGATIVE

## 2021-05-21 NOTE — Pre-Procedure Instructions (Signed)
Surgical Instructions    Your procedure is scheduled on Thursday July 14th .  Report to [facility] Main Entrance "A" at 2:00 P.M., then check in with the Admitting office.  Call this number if you have problems the morning of surgery:  [phone]   If you have any questions prior to your surgery date call [phone]: Open Monday-Friday 8am-4pm    Remember:  Do not eat or after midnight the night before your surgery  You may drink water, clear juices (without pulp), carbonated beverages, black coffee, black tea, gatorade until 1:00 pm.    Take these medicines the morning of surgery with A SIP OF WATER   cetirizine (ZYRTEC)  finasteride (PROSCAR)  fluticasone (FLONASE)     As of today, STOP taking any Aspirin (unless otherwise instructed by your surgeon) Aleve, Naproxen, Ibuprofen, Motrin, Advil, Goody's, BC's, all herbal medications, fish oil, and all vitamins.                     Do NOT Smoke (Tobacco/Vaping) or drink Alcohol 24 hours prior to your procedure.  If you use a CPAP at night, you may bring all equipment for your overnight stay.   Contacts, glasses, piercing's, hearing aid's, dentures or partials may not be worn into surgery, please bring cases for these belongings.    For patients admitted to the hospital, discharge time will be determined by your treatment team.   Patients discharged the day of surgery will not be allowed to drive home, and someone needs to stay with them for 24 hours.  ONLY 1 SUPPORT PERSON MAY BE PRESENT WHILE YOU ARE IN SURGERY. IF YOU ARE TO BE ADMITTED ONCE YOU ARE IN YOUR ROOM YOU WILL BE ALLOWED TWO (2) VISITORS.  Minor children may have two parents present. Special consideration for safety and communication needs will be reviewed on a case by case basis.   Special instructions:   - Preparing For Surgery  Before surgery, you can play an important role. Because skin is not sterile, your skin needs to be as free of germs as  possible. You can reduce the number of germs on your skin by washing with CHG (chlorahexidine gluconate) Soap before surgery.  CHG is an antiseptic cleaner which kills germs and bonds with the skin to continue killing germs even after washing.    Oral Hygiene is also important to reduce your risk of infection.  Remember - BRUSH YOUR TEETH THE MORNING OF SURGERY WITH YOUR REGULAR TOOTHPASTE  Please do not use if you have an allergy to CHG or antibacterial soaps. If your skin becomes reddened/irritated stop using the CHG.  Do not shave (including legs and underarms) for at least 48 hours prior to first CHG shower. It is OK to shave your face.  Please follow these instructions carefully.   Shower the NIGHT BEFORE SURGERY and the MORNING OF SURGERY  If you chose to wash your hair, wash your hair first as usual with your normal shampoo.  After you shampoo, rinse your hair and body thoroughly to remove the shampoo.  Use CHG Soap as you would any other liquid soap. You can apply CHG directly to the skin and wash gently with a scrungie or a clean washcloth.   Apply the CHG Soap to your body ONLY FROM THE NECK DOWN.  Do not use on open wounds or open sores. Avoid contact with your eyes, ears, mouth and genitals (private parts). Wash Face and genitals (private parts)  with your normal soap.   Wash thoroughly, paying special attention to the area where your surgery will be performed.  Thoroughly rinse your body with warm water from the neck down.  DO NOT shower/wash with your normal soap after using and rinsing off the CHG Soap.  Pat yourself dry with a CLEAN TOWEL.  Wear CLEAN PAJAMAS to bed the night before surgery  Place CLEAN SHEETS on your bed the night before your surgery  DO NOT SLEEP WITH PETS.   Day of Surgery: Shower with CHG soap. Do not wear jewelry, make up, nail polish, gel polish, artificial nails, or any other type of covering on natural nails including finger and  toenails. If patients have artificial nails, gel coating, etc. that need to be removed by a nail salon please have this removed prior to surgery. Surgery may need to be canceled/delayed if the surgeon/ anesthesia feels like the patient is unable to be adequately monitored. Do not wear lotions, powders, perfumes/colognes, or deodorant. Do not shave 48 hours prior to surgery.  Men may shave face and neck. Do not bring valuables to the hospital. [facility] is not responsible for any belongings or valuables. Wear Clean/Comfortable clothing the morning of surgery Remember to brush your teeth WITH YOUR REGULAR TOOTHPASTE.   Please read over the following fact sheets that you were given.

## 2021-05-21 NOTE — Progress Notes (Signed)
PCP: Burnard Bunting, MD Cardiologist: denies  EKG: 05/21/21 CXR: na ECHO: denies Stress Test: denies Cardiac Cath: denies  Fasting Blood Sugar- na Checks Blood Sugar__na_ times a day  OSA/CPAP: No  ASA/Blood Thinners: No  Covid test 05/21/21 at PAT  Anesthesia Yes, CAD in history, but patient denies.  Spoke to [place], [place] about this patient.   Patient denies shortness of breath, fever, cough, and chest pain at PAT appointment.  Patient verbalized understanding of instructions provided today at the PAT appointment.  Patient asked to review instructions at home and day of surgery.

## 2021-05-21 NOTE — Anesthesia Preprocedure Evaluation (Addendum)
Anesthesia Evaluation  Patient identified by MRN, date of birth, ID band Patient awake    Reviewed: Allergy & Precautions, NPO status , Patient's Chart, lab work & pertinent test results  History of Anesthesia Complications Negative for: history of anesthetic complications  Airway Mallampati: II  TM Distance: >3 FB Neck ROM: Full    Dental no notable dental hx. (+) Dental Advisory Given   Pulmonary neg pulmonary ROS,    Pulmonary exam normal        Cardiovascular negative cardio ROS Normal cardiovascular exam     Neuro/Psych negative neurological ROS     GI/Hepatic negative GI ROS, Neg liver ROS,   Endo/Other  negative endocrine ROS  Renal/GU negative Renal ROS     Musculoskeletal negative musculoskeletal ROS (+)   Abdominal   Peds  Hematology negative hematology ROS (+)   Anesthesia Other Findings   Reproductive/Obstetrics                            Anesthesia Physical Anesthesia Plan  ASA: 2  Anesthesia Plan: General   Post-op Pain Management:    Induction: Intravenous  PONV Risk Score and Plan: 3 and Ondansetron, Dexamethasone and Midazolam  Airway Management Planned: Oral ETT  Additional Equipment:   Intra-op Plan:   Post-operative Plan: Extubation in OR  Informed Consent: I have reviewed the patients History and Physical, chart, labs and discussed the procedure including the risks, benefits and alternatives for the proposed anesthesia with the patient or authorized representative who has indicated his/her understanding and acceptance.     Dental advisory given  Plan Discussed with: Anesthesiologist and CRNA  Anesthesia Plan Comments: (PAT note written 05/21/2021 by Myra Gianotti, PA-C. )      Anesthesia Quick Evaluation

## 2021-05-21 NOTE — Progress Notes (Signed)
Anesthesia Chart Review:  Case: [id number] Date/Time: 05/22/21 1550   Procedure: Left Lumbar 3-4 Lumbar 4-5 Microdiscectomy (Left) - 3C/RM 20   Anesthesia type: General   Pre-op diagnosis: Lumbar disc herniation with radiculopathy   Location: [facility] OR ROOM 19 / [facility]   Surgeons: Newman Pies, MD       DISCUSSION: Patient is a 75 year old male scheduled for the above procedure.   History includes never smoker, HLD, Rosacea, BPH, WPW (see below), back surgery, 1976, 1997), nasal septal surgery (1983, 2007).  Patient is not routinely followed by cardiology. He was previously being managed from a primary care and cardiology standpoint by Dr. Mare Ferrari until his retirement many years ago. Last visit with Dr. Mare Ferrari seen was from 09/18/11. His note provides details about the WPW history. He notes that patient is in good general health but when he was first seen by Dr. Mare Ferrari in 1976 EKG had WPW pattern which persisted until at least 1987 but was not present in 1991. Patient had never had tachycardia associated with WPW. He had a normal Cardiolite stress test in 1999. There is no mention of CAD history in Dr. Sherryl Barters note, and patient denied CAD history at his 05/21/21 PAT visit. Mr. Sayre says he has never been symptomatic of WPW pattern.  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   05/21/21 EKG showed SB, old septal infarct. He denied CV symptoms and reported activity > 4 METS.   Anesthesia team to evaluate on the day of surgery. 05/21/21 presurgical COVID-19 test in process.    VS: BP (!) 141/86   Pulse 65   Temp 36.5 C (Oral)   Resp 18   Ht 5\' 7"  (1.702 m)   Wt 79.4 kg   SpO2 98%   BMI 27.42 kg/m    PROVIDERS: Burnard Bunting, MD is PCP. 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    LABS: Labs reviewed:  Acceptable for surgery. (all labs ordered are listed, but only abnormal results are displayed)  Labs Reviewed  SURGICAL PCR SCREEN  SARS CORONAVIRUS 2 (TAT 6-24 HRS)  CBC  BASIC METABOLIC PANEL     IMAGES: MRI L-spine 05/13/21 (report in [facility]/PACS): MPRESSION: 1. Advanced degenerative lumbar spondylosis with multilevel disc disease and facet disease. 2. Multilevel multifactorial spinal, lateral recess and foraminal stenosis as discussed above at the individual levels. The most significant levels are L3-4, L4-5 and L5-S1. 3. Small free disc fragment suspected behind the L4 vertebral body on the left side which extends down toward the left neural foramen and may involve the left L4 nerve root.   EKG: 05/21/21:  Sinus bradycardia at 52 bpm Septal infarct , age undetermined Abnormal ECG - Only comparison EKG available is from 08/29/13. Septal infarct is old.   CV: Treadmill Cardiolite stress test 08/13/98: Normal with no clinical electrocardiographic or scintigraphic evidence of ischemia. LV function is normal.    Past Medical History:  Diagnosis Date   Allergy    Arthritis    BPH (benign prostatic hyperplasia)    Dizziness    Mild hyperlipidemia    Mononucleosis 11/09/1957   Rosacea    Wolf-Parkinson-White syndrome     Past Surgical History:  Procedure Laterality Date   back pain  2013   epidurals x2 for pain relief   COLONOSCOPY  2011   2006, no polyps   LAMINECTOMY  11/09/1974   microdiscectemy  02/1996   NASAL SEPTOPLASTY W/ TURBINOPLASTY  2007   uvelectemy   NASAL SEPTUM SURGERY  11/09/1981   SHOULDER SURGERY  11/09/1988   VASECTOMY  11/09/1980    MEDICATIONS:  cetirizine (ZYRTEC) 10 MG tablet   donepezil (ARICEPT) 10 MG tablet   finasteride (PROSCAR) 5 MG tablet   fluticasone (FLONASE) 50 MCG/ACT nasal spray   Multiple Vitamin (MULTI-VITAMIN PO)   naproxen sodium (ALEVE) 220 MG tablet   Omega-3 Fatty Acids (FISH OIL PO)   zolpidem (AMBIEN) 5 MG  tablet   No current facility-administered medications for this encounter.    Myra Gianotti, PA-C Surgical Short Stay/Anesthesiology [facility] Phone [phone] [facility] Phone [phone] 05/21/2021 12:14 PM

## 2021-05-22 ENCOUNTER — Encounter (HOSPITAL_COMMUNITY)
Admission: RE | Disposition: A | Discharge status: Home / Self Care | Payer: Self-pay | Source: Home / Self Care | Attending: Neurosurgery

## 2021-05-22 ENCOUNTER — Ambulatory Visit (HOSPITAL_COMMUNITY): Payer: Medicare Other

## 2021-05-22 ENCOUNTER — Encounter (HOSPITAL_COMMUNITY): Payer: Self-pay | Admitting: Neurosurgery

## 2021-05-22 ENCOUNTER — Other Ambulatory Visit: Payer: Self-pay

## 2021-05-22 ENCOUNTER — Other Ambulatory Visit (HOSPITAL_COMMUNITY): Payer: Self-pay | Admitting: Neurosurgery

## 2021-05-22 ENCOUNTER — Ambulatory Visit (HOSPITAL_COMMUNITY)
Admission: RE | Admit: 2021-05-22 | Discharge: 2021-05-23 | Disposition: A | Discharge status: Home / Self Care | Payer: Medicare Other | Attending: Neurosurgery | Admitting: Neurosurgery

## 2021-05-22 ENCOUNTER — Ambulatory Visit (HOSPITAL_COMMUNITY): Payer: Medicare Other | Admitting: Vascular Surgery

## 2021-05-22 DIAGNOSIS — Z419 Encounter for procedure for purposes other than remedying health state, unspecified: Secondary | ICD-10-CM

## 2021-05-22 DIAGNOSIS — Z9889 Other specified postprocedural states: Secondary | ICD-10-CM | POA: Diagnosis not present

## 2021-05-22 DIAGNOSIS — Z79899 Other long term (current) drug therapy: Secondary | ICD-10-CM | POA: Diagnosis not present

## 2021-05-22 DIAGNOSIS — M5116 Intervertebral disc disorders with radiculopathy, lumbar region: Secondary | ICD-10-CM | POA: Insufficient documentation

## 2021-05-22 DIAGNOSIS — Z880 Allergy status to penicillin: Secondary | ICD-10-CM | POA: Diagnosis not present

## 2021-05-22 DIAGNOSIS — M48061 Spinal stenosis, lumbar region without neurogenic claudication: Secondary | ICD-10-CM | POA: Diagnosis not present

## 2021-05-22 DIAGNOSIS — E7849 Other hyperlipidemia: Secondary | ICD-10-CM | POA: Diagnosis not present

## 2021-05-22 DIAGNOSIS — M7138 Other bursal cyst, other site: Secondary | ICD-10-CM | POA: Diagnosis not present

## 2021-05-22 DIAGNOSIS — M5126 Other intervertebral disc displacement, lumbar region: Secondary | ICD-10-CM | POA: Diagnosis present

## 2021-05-22 DIAGNOSIS — R2681 Unsteadiness on feet: Secondary | ICD-10-CM | POA: Insufficient documentation

## 2021-05-22 HISTORY — PX: LUMBAR LAMINECTOMY/DECOMPRESSION MICRODISCECTOMY: SHX5026

## 2021-05-22 SURGERY — LUMBAR LAMINECTOMY/DECOMPRESSION MICRODISCECTOMY 1 LEVEL
Anesthesia: General | Site: Spine Lumbar | Laterality: Left

## 2021-05-22 MED ORDER — CYCLOBENZAPRINE HCL 10 MG PO TABS
10.0000 mg | ORAL_TABLET | Freq: Three times a day (TID) | ORAL | Status: DC | PRN
  Administered 2021-05-22: 10 mg via ORAL

## 2021-05-22 MED ORDER — LACTATED RINGERS IV SOLN: INTRAVENOUS | Status: DC

## 2021-05-22 MED ORDER — SODIUM CHLORIDE 0.9% FLUSH: 3.0000 mL | INTRAVENOUS | Status: DC | PRN

## 2021-05-22 MED ORDER — PHENYLEPHRINE HCL (PRESSORS) 10 MG/ML IV SOLN
INTRAVENOUS | Status: AC
  Filled 2021-05-22: qty 1

## 2021-05-22 MED ORDER — CELECOXIB 200 MG PO CAPS: 200.0000 mg | ORAL_CAPSULE | Freq: Once | ORAL | Status: AC

## 2021-05-22 MED ORDER — CHLORHEXIDINE GLUCONATE CLOTH 2 % EX PADS: 6.0000 | MEDICATED_PAD | Freq: Once | CUTANEOUS | Status: DC

## 2021-05-22 MED ORDER — LIDOCAINE 2% (20 MG/ML) 5 ML SYRINGE
INTRAMUSCULAR | Status: AC
  Filled 2021-05-22: qty 5

## 2021-05-22 MED ORDER — EPHEDRINE SULFATE-NACL 50-0.9 MG/10ML-% IV SOSY
PREFILLED_SYRINGE | INTRAVENOUS | Status: DC | PRN
  Administered 2021-05-22 (×2): 10 mg via INTRAVENOUS
  Administered 2021-05-22: 5 mg via INTRAVENOUS

## 2021-05-22 MED ORDER — PHENYLEPHRINE HCL-NACL 10-0.9 MG/250ML-% IV SOLN
INTRAVENOUS | Status: DC | PRN
  Administered 2021-05-22: 20 ug/min via INTRAVENOUS

## 2021-05-22 MED ORDER — PHENOL 1.4 % MT LIQD: 1.0000 | OROMUCOSAL | Status: DC | PRN

## 2021-05-22 MED ORDER — VANCOMYCIN HCL IN DEXTROSE 1-5 GM/200ML-% IV SOLN: 1000.0000 mg | INTRAVENOUS | Status: AC

## 2021-05-22 MED ORDER — ACETAMINOPHEN 500 MG PO TABS
ORAL_TABLET | ORAL | Status: AC
  Administered 2021-05-22: 1000 mg via ORAL
  Filled 2021-05-22: qty 2

## 2021-05-22 MED ORDER — ACETAMINOPHEN 500 MG PO TABS
1000.0000 mg | ORAL_TABLET | Freq: Four times a day (QID) | ORAL | Status: DC
  Administered 2021-05-22 – 2021-05-23 (×2): 1000 mg via ORAL
  Filled 2021-05-22 (×2): qty 2

## 2021-05-22 MED ORDER — DOCUSATE SODIUM 100 MG PO CAPS
100.0000 mg | ORAL_CAPSULE | Freq: Two times a day (BID) | ORAL | Status: DC
  Administered 2021-05-22: 100 mg via ORAL
  Filled 2021-05-22: qty 1

## 2021-05-22 MED ORDER — OXYCODONE HCL 5 MG PO TABS
10.0000 mg | ORAL_TABLET | ORAL | Status: DC | PRN
Start: 2021-05-22 — End: 2021-05-23

## 2021-05-22 MED ORDER — MENTHOL 3 MG MT LOZG: 1.0000 | LOZENGE | OROMUCOSAL | Status: DC | PRN

## 2021-05-22 MED ORDER — DEXAMETHASONE SODIUM PHOSPHATE 10 MG/ML IJ SOLN
INTRAMUSCULAR | Status: DC | PRN
  Administered 2021-05-22: 10 mg via INTRAVENOUS

## 2021-05-22 MED ORDER — CYCLOBENZAPRINE HCL 10 MG PO TABS
ORAL_TABLET | ORAL | Status: AC
  Filled 2021-05-22: qty 1

## 2021-05-22 MED ORDER — OXYCODONE HCL 5 MG PO TABS
5.0000 mg | ORAL_TABLET | ORAL | Status: DC | PRN
Start: 2021-05-22 — End: 2021-05-23

## 2021-05-22 MED ORDER — ONDANSETRON HCL 4 MG/2ML IJ SOLN: 4.0000 mg | Freq: Four times a day (QID) | INTRAMUSCULAR | Status: DC | PRN

## 2021-05-22 MED ORDER — ROCURONIUM BROMIDE 10 MG/ML (PF) SYRINGE
PREFILLED_SYRINGE | INTRAVENOUS | Status: DC | PRN
  Administered 2021-05-22: 50 mg via INTRAVENOUS
  Administered 2021-05-22: 20 mg via INTRAVENOUS

## 2021-05-22 MED ORDER — SUGAMMADEX SODIUM 200 MG/2ML IV SOLN
INTRAVENOUS | Status: DC | PRN
  Administered 2021-05-22: 200 mg via INTRAVENOUS

## 2021-05-22 MED ORDER — THROMBIN 5000 UNITS EX SOLR
CUTANEOUS | Status: AC
  Filled 2021-05-22: qty 10000

## 2021-05-22 MED ORDER — LIDOCAINE 2% (20 MG/ML) 5 ML SYRINGE
INTRAMUSCULAR | Status: DC | PRN
  Administered 2021-05-22: 60 mg via INTRAVENOUS

## 2021-05-22 MED ORDER — CHLORHEXIDINE GLUCONATE 0.12 % MT SOLN: 15.0000 mL | Freq: Once | OROMUCOSAL | Status: AC

## 2021-05-22 MED ORDER — PROPOFOL 10 MG/ML IV BOLUS
INTRAVENOUS | Status: DC | PRN
  Administered 2021-05-22: 125 mg via INTRAVENOUS

## 2021-05-22 MED ORDER — SODIUM CHLORIDE 0.9% FLUSH: 3.0000 mL | Freq: Two times a day (BID) | INTRAVENOUS | Status: DC

## 2021-05-22 MED ORDER — DONEPEZIL HCL 10 MG PO TABS
10.0000 mg | ORAL_TABLET | Freq: Every day | ORAL | Status: DC
  Filled 2021-05-22: qty 1

## 2021-05-22 MED ORDER — VANCOMYCIN HCL IN DEXTROSE 1-5 GM/200ML-% IV SOLN
1000.0000 mg | Freq: Once | INTRAVENOUS | Status: AC
  Administered 2021-05-23: 1000 mg via INTRAVENOUS
  Filled 2021-05-22: qty 200

## 2021-05-22 MED ORDER — SODIUM CHLORIDE 0.9 % IV SOLN: 250.0000 mL | INTRAVENOUS | Status: DC

## 2021-05-22 MED ORDER — PROMETHAZINE HCL 25 MG/ML IJ SOLN: 6.2500 mg | INTRAMUSCULAR | Status: DC | PRN

## 2021-05-22 MED ORDER — FINASTERIDE 5 MG PO TABS: 5.0000 mg | ORAL_TABLET | Freq: Every day | ORAL | Status: DC

## 2021-05-22 MED ORDER — FENTANYL CITRATE (PF) 250 MCG/5ML IJ SOLN
INTRAMUSCULAR | Status: AC
  Filled 2021-05-22: qty 5

## 2021-05-22 MED ORDER — CELECOXIB 200 MG PO CAPS
ORAL_CAPSULE | ORAL | Status: AC
  Administered 2021-05-22: 200 mg via ORAL
  Filled 2021-05-22: qty 1

## 2021-05-22 MED ORDER — LORATADINE 10 MG PO TABS: 10.0000 mg | ORAL_TABLET | Freq: Every day | ORAL | Status: DC

## 2021-05-22 MED ORDER — MORPHINE SULFATE (PF) 4 MG/ML IV SOLN: 4.0000 mg | INTRAVENOUS | Status: DC | PRN

## 2021-05-22 MED ORDER — BUPIVACAINE-EPINEPHRINE 0.5% -1:200000 IJ SOLN
INTRAMUSCULAR | Status: AC
  Filled 2021-05-22: qty 1

## 2021-05-22 MED ORDER — BACITRACIN ZINC 500 UNIT/GM EX OINT
TOPICAL_OINTMENT | CUTANEOUS | Status: DC | PRN
  Administered 2021-05-22: 1 via TOPICAL

## 2021-05-22 MED ORDER — ACETAMINOPHEN 650 MG RE SUPP: 650.0000 mg | RECTAL | Status: DC | PRN

## 2021-05-22 MED ORDER — SUCCINYLCHOLINE CHLORIDE 200 MG/10ML IV SOSY
PREFILLED_SYRINGE | INTRAVENOUS | Status: AC
  Filled 2021-05-22: qty 10

## 2021-05-22 MED ORDER — ORAL CARE MOUTH RINSE: 15.0000 mL | Freq: Once | OROMUCOSAL | Status: AC

## 2021-05-22 MED ORDER — BISACODYL 10 MG RE SUPP: 10.0000 mg | Freq: Every day | RECTAL | Status: DC | PRN

## 2021-05-22 MED ORDER — THROMBIN 5000 UNITS EX SOLR: OROMUCOSAL | Status: DC | PRN

## 2021-05-22 MED ORDER — ONDANSETRON HCL 4 MG PO TABS: 4.0000 mg | ORAL_TABLET | Freq: Four times a day (QID) | ORAL | Status: DC | PRN

## 2021-05-22 MED ORDER — BACITRACIN ZINC 500 UNIT/GM EX OINT
TOPICAL_OINTMENT | CUTANEOUS | Status: AC
  Filled 2021-05-22: qty 28.35

## 2021-05-22 MED ORDER — GLYCOPYRROLATE 0.2 MG/ML IJ SOLN
INTRAMUSCULAR | Status: DC | PRN
  Administered 2021-05-22 (×2): .1 mg via INTRAVENOUS

## 2021-05-22 MED ORDER — VANCOMYCIN HCL IN DEXTROSE 1-5 GM/200ML-% IV SOLN
INTRAVENOUS | Status: AC
  Administered 2021-05-22: 1000 mg via INTRAVENOUS
  Filled 2021-05-22: qty 200

## 2021-05-22 MED ORDER — PROPOFOL 10 MG/ML IV BOLUS
INTRAVENOUS | Status: AC
  Filled 2021-05-22: qty 20

## 2021-05-22 MED ORDER — BUPIVACAINE-EPINEPHRINE (PF) 0.5% -1:200000 IJ SOLN
INTRAMUSCULAR | Status: DC | PRN
  Administered 2021-05-22 (×2): 10 mL

## 2021-05-22 MED ORDER — ONDANSETRON HCL 4 MG/2ML IJ SOLN
INTRAMUSCULAR | Status: DC | PRN
  Administered 2021-05-22: 4 mg via INTRAVENOUS

## 2021-05-22 MED ORDER — LACTATED RINGERS IV SOLN: INTRAVENOUS | Status: DC | PRN

## 2021-05-22 MED ORDER — 0.9 % SODIUM CHLORIDE (POUR BTL) OPTIME
TOPICAL | Status: DC | PRN
  Administered 2021-05-22: 1000 mL

## 2021-05-22 MED ORDER — FLUTICASONE PROPIONATE 50 MCG/ACT NA SUSP
1.0000 | Freq: Every day | NASAL | Status: DC
  Filled 2021-05-22: qty 16

## 2021-05-22 MED ORDER — FENTANYL CITRATE (PF) 250 MCG/5ML IJ SOLN
INTRAMUSCULAR | Status: DC | PRN
  Administered 2021-05-22: 75 ug via INTRAVENOUS
  Administered 2021-05-22 (×2): 50 ug via INTRAVENOUS
  Administered 2021-05-22: 25 ug via INTRAVENOUS
  Administered 2021-05-22: 50 ug via INTRAVENOUS

## 2021-05-22 MED ORDER — ACETAMINOPHEN 500 MG PO TABS: 1000.0000 mg | ORAL_TABLET | Freq: Once | ORAL | Status: AC

## 2021-05-22 MED ORDER — EPHEDRINE 5 MG/ML INJ
INTRAVENOUS | Status: AC
  Filled 2021-05-22: qty 10

## 2021-05-22 MED ORDER — AMISULPRIDE (ANTIEMETIC) 5 MG/2ML IV SOLN: 10.0000 mg | Freq: Once | INTRAVENOUS | Status: DC | PRN

## 2021-05-22 MED ORDER — ROCURONIUM BROMIDE 10 MG/ML (PF) SYRINGE
PREFILLED_SYRINGE | INTRAVENOUS | Status: AC
  Filled 2021-05-22: qty 20

## 2021-05-22 MED ORDER — FENTANYL CITRATE (PF) 100 MCG/2ML IJ SOLN: 25.0000 ug | INTRAMUSCULAR | Status: DC | PRN

## 2021-05-22 MED ORDER — ALBUMIN HUMAN 5 % IV SOLN: INTRAVENOUS | Status: DC | PRN

## 2021-05-22 MED ORDER — CHLORHEXIDINE GLUCONATE 0.12 % MT SOLN
OROMUCOSAL | Status: AC
  Administered 2021-05-22: 15 mL via OROMUCOSAL
  Filled 2021-05-22: qty 15

## 2021-05-22 MED ORDER — ACETAMINOPHEN 325 MG PO TABS: 650.0000 mg | ORAL_TABLET | ORAL | Status: DC | PRN

## 2021-05-22 SURGICAL SUPPLY — 49 items
APL SKNCLS STERI-STRIP NONHPOA (GAUZE/BANDAGES/DRESSINGS) ×1
BAG COUNTER SPONGE SURGICOUNT (BAG) ×2 IMPLANT
BAG SPNG CNTER NS LX DISP (BAG) ×1
BAND INSRT 18 STRL LF DISP RB (MISCELLANEOUS) ×2
BAND RUBBER #18 3X1/16 STRL (MISCELLANEOUS) ×4 IMPLANT
BENZOIN TINCTURE PRP APPL 2/3 (GAUZE/BANDAGES/DRESSINGS) ×2 IMPLANT
BLADE CLIPPER SURG (BLADE) IMPLANT
BUR MATCHSTICK NEURO 3.0 LAGG (BURR) ×2 IMPLANT
BUR PRECISION FLUTE 6.0 (BURR) ×2 IMPLANT
CANISTER SUCT 3000ML PPV (MISCELLANEOUS) ×2 IMPLANT
CARTRIDGE OIL MAESTRO DRILL (MISCELLANEOUS) ×1 IMPLANT
CLSR STERI-STRIP ANTIMIC 1/2X4 (GAUZE/BANDAGES/DRESSINGS) ×1 IMPLANT
DIFFUSER DRILL AIR PNEUMATIC (MISCELLANEOUS) ×2 IMPLANT
DRAPE LAPAROTOMY 100X72X124 (DRAPES) ×2 IMPLANT
DRAPE MICROSCOPE LEICA (MISCELLANEOUS) ×2 IMPLANT
DRAPE SURG 17X23 STRL (DRAPES) ×8 IMPLANT
DRSG OPSITE POSTOP 4X6 (GAUZE/BANDAGES/DRESSINGS) ×1 IMPLANT
ELECT BLADE 4.0 EZ CLEAN MEGAD (MISCELLANEOUS) ×2
ELECT REM PT RETURN 9FT ADLT (ELECTROSURGICAL) ×2
ELECTRODE BLDE 4.0 EZ CLN MEGD (MISCELLANEOUS) ×1 IMPLANT
ELECTRODE REM PT RTRN 9FT ADLT (ELECTROSURGICAL) ×1 IMPLANT
GAUZE 4X4 16PLY ~~LOC~~+RFID DBL (SPONGE) IMPLANT
GAUZE SPONGE 4X4 12PLY STRL (GAUZE/BANDAGES/DRESSINGS) ×2 IMPLANT
GLOVE EXAM NITRILE XL STR (GLOVE) IMPLANT
GLOVE SURG ENC MOIS LTX SZ8 (GLOVE) ×2 IMPLANT
GLOVE SURG ENC MOIS LTX SZ8.5 (GLOVE) ×2 IMPLANT
GOWN STRL REUS W/ TWL LRG LVL3 (GOWN DISPOSABLE) IMPLANT
GOWN STRL REUS W/ TWL XL LVL3 (GOWN DISPOSABLE) ×1 IMPLANT
GOWN STRL REUS W/TWL 2XL LVL3 (GOWN DISPOSABLE) IMPLANT
GOWN STRL REUS W/TWL LRG LVL3 (GOWN DISPOSABLE) ×2
GOWN STRL REUS W/TWL XL LVL3 (GOWN DISPOSABLE) ×2
KIT BASIN OR (CUSTOM PROCEDURE TRAY) ×2 IMPLANT
KIT TURNOVER KIT B (KITS) ×2 IMPLANT
NDL HYPO 21X1.5 SAFETY (NEEDLE) IMPLANT
NEEDLE HYPO 21X1.5 SAFETY (NEEDLE) IMPLANT
NEEDLE HYPO 22GX1.5 SAFETY (NEEDLE) ×2 IMPLANT
NS IRRIG 1000ML POUR BTL (IV SOLUTION) ×2 IMPLANT
OIL CARTRIDGE MAESTRO DRILL (MISCELLANEOUS) ×2
PACK LAMINECTOMY NEURO (CUSTOM PROCEDURE TRAY) ×2 IMPLANT
PAD ARMBOARD 7.5X6 YLW CONV (MISCELLANEOUS) ×6 IMPLANT
PATTIES SURGICAL .5 X1 (DISPOSABLE) IMPLANT
SPONGE SURGIFOAM ABS GEL SZ50 (HEMOSTASIS) ×2 IMPLANT
STRIP CLOSURE SKIN 1/2X4 (GAUZE/BANDAGES/DRESSINGS) ×2 IMPLANT
SUT VIC AB 1 CT1 18XBRD ANBCTR (SUTURE) ×1 IMPLANT
SUT VIC AB 1 CT1 8-18 (SUTURE) ×2
SUT VIC AB 2-0 CP2 18 (SUTURE) ×2 IMPLANT
TOWEL GREEN STERILE (TOWEL DISPOSABLE) ×2 IMPLANT
TOWEL GREEN STERILE FF (TOWEL DISPOSABLE) ×2 IMPLANT
WATER STERILE IRR 1000ML POUR (IV SOLUTION) ×2 IMPLANT

## 2021-05-22 NOTE — Transfer of Care (Signed)
Immediate Anesthesia Transfer of Care Note  Patient: Chase [name]  Procedure(s) Performed: Left Lumbar Three-Four Lumbar Four-Five Microdiscectomy (Left: Spine Lumbar)  Patient Location: PACU  Anesthesia Type:General  Level of Consciousness: awake, alert  and oriented  Airway & Oxygen Therapy: Patient Spontanous Breathing and Patient connected to face mask oxygen  Post-op Assessment: Report given to RN and Post -op Vital signs reviewed and stable  Post vital signs: Reviewed and stable  Last Vitals:  Vitals Value Taken Time  BP 134/65 05/22/21 1941  Temp    Pulse 81 05/22/21 1943  Resp 17 05/22/21 1943  SpO2 100 % 05/22/21 1943  Vitals shown include unvalidated device data.  Last Pain:  Vitals:   05/22/21 1433  TempSrc: Oral  PainSc:          Complications: No notable events documented.

## 2021-05-22 NOTE — Anesthesia Procedure Notes (Signed)
Procedure Name: Intubation Date/Time: 05/22/2021 5:46 PM Performed by: Effie Berkshire, MD Pre-anesthesia Checklist: Patient identified, Emergency Drugs available, Suction available and Patient being monitored Patient Re-evaluated:Patient Re-evaluated prior to induction Oxygen Delivery Method: Circle system utilized Preoxygenation: Pre-oxygenation with 100% oxygen Induction Type: IV induction Ventilation: Mask ventilation without difficulty Laryngoscope Size: Mac and 4 Grade View: Grade II Tube type: Oral Tube size: 7.5 mm Number of attempts: 1 Airway Equipment and Method: Stylet and Oral airway Placement Confirmation: ETT inserted through vocal cords under direct vision, positive ETCO2 and breath sounds checked- equal and bilateral Secured at: 22 cm Tube secured with: Tape Dental Injury: Teeth and Oropharynx as per pre-operative assessment  Comments: Grade 2b view

## 2021-05-22 NOTE — Op Note (Signed)
Brief history: The patient is a 75 year old white male who has had previous back surgeries by other physicians.  Recently he developed acute left leg pain, numbness, tingling and weakness.  He failed medical management and was worked up with a lumbar MRI.  This demonstrated a synovial cyst at L3-4 and a herniated disc at L4-5.  I discussed the various treatment options.  He has decided proceed with surgery after weighing the risk, benefits and alternatives.  Preoperative diagnosis: Left L3-4 synovial cyst/spinal stenosis, left L4-5 herniated disc, lumbago, lumbar radiculopathy  Postoperative diagnosis: The same  Procedure: Left L4-5 discectomy with resection of herniated disc, left L3-4 laminotomy/foraminotomies to remove cervical cyst i using micro-dissection  Surgeon: Dr. Earle Gell  Asst.: None  Anesthesia: Gen. endotracheal  Estimated blood loss: Minimal  Drains: None  Complications: None  Description of procedure: The patient was brought to the operating room by the anesthesia team. General endotracheal anesthesia was induced. The patient was turned to the prone position on the Wilson frame. The patient's lumbosacral region was then prepared with Betadine scrub and Betadine solution. Sterile drapes were applied.  I then injected the area to be incised with Marcaine with epinephrine solution. I then used a scalpel to make a linear midline incision over the L3-4 and L4-5 intervertebral disc space, incising through the old scar. I then used electrocautery to perform a left sided subperiosteal dissection exposing the spinous process and lamina of L3, L4 and L5. We obtained intraoperative radiograph to confirm our location. I then inserted the [facility] retractor for exposure.  We then brought the operative microscope into the field. Under its magnification and illumination we completed the microdissection. I used a high-speed drill to perform a laminotomy at L3-4 and L4-5 on the left. I  then used a Kerrison punches to complete the left L4 hemilaminectomy, widen the laminotomy and remove the ligamentum flavum at L3-4 and L4-5, and to remove the synovial cyst on the left at L3-4.  We then used microdissection to free up the thecal sac and the left L4 and L5 nerve root from the epidural tissue. I then used a Kerrison punch to perform a foraminotomy at about the left L4 and L5 nerve root. We then using the nerve root retractor to gently retract the thecal sac medially. This exposed the intervertebral disc, as expected in the axilla of the L4 nerve root compressing on the nerve root as it exited the foramen.  We remove the ruptured disc and multiple fragments using the micropituitary forceps.  We inspected the intervertebral disc at L3-4 and L4-5.  It appeared that herniation came from the L4-5 disc.  There was a small hole in the annulus.  We did not enter into the intervertebral disc space.  I then palpated along the ventral surface of the thecal sac and along exit route of the left L4 and L5 nerve root and noted that the neural structures were well decompressed. This completed the decompression.  We then obtained hemostasis using bipolar electrocautery. We irrigated the wound out with bacitracin solution. We then removed the retractor. We then reapproximated the patient's thoracolumbar fascia with interrupted #1 Vicryl suture. We then reapproximated the patient's subcutaneous tissue with interrupted 2-0 Vicryl suture. We then reapproximated patient's skin with Steri-Strips and benzoin. The was then coated with bacitracin ointment. The drapes were removed. The patient was subsequently returned to the supine position where they were extubated by the anesthesia team. The patient was then transported to the postanesthesia care unit  in stable condition. All sponge instrument and needle counts were reportedly correct at the end of this case.

## 2021-05-22 NOTE — Anesthesia Procedure Notes (Deleted)
Procedure Name: Intubation Date/Time: 05/22/2021 5:47 PM Performed by: Effie Berkshire, MD Pre-anesthesia Checklist: Patient identified, Emergency Drugs available, Suction available and Patient being monitored Patient Re-evaluated:Patient Re-evaluated prior to induction Oxygen Delivery Method: Circle System Utilized Preoxygenation: Pre-oxygenation with 100% oxygen Induction Type: IV induction Ventilation: Mask ventilation without difficulty Laryngoscope Size: Mac and 4 Grade View: Grade II Tube type: Oral Number of attempts: 1 Airway Equipment and Method: Stylet and Oral airway Placement Confirmation: ETT inserted through vocal cords under direct vision, positive ETCO2 and breath sounds checked- equal and bilateral Secured at: 22 cm Tube secured with: Tape Dental Injury: Teeth and Oropharynx as per pre-operative assessment  Comments: DL x 2 by this CRNA, G3 view, DL by Smith Robert MD 2b view, +intubation with cricoid pressure.

## 2021-05-22 NOTE — Progress Notes (Signed)
Pharmacy Antibiotic Note  Chase [name] is a 75 y.o. male admitted on 05/22/2021 for back surgery with surgical prophylaxis.  Pharmacy has been consulted for Vancomycin dosing. Patient was given pre-operatively Vancomycin 1000 mg at 16:30 PM. Incision was closed with no drain.   Plan: Vancomycin 1000 mg IV x 1 dose at 04:30 AM.  No further dosing as no drain in place.  Pharmacy will sign off. Please re-consult if needed.   Height: 5\' 7"  (170.2 cm) Weight: 78.9 kg (174 lb) IBW/kg (Calculated) : 66.1  Temp (24hrs), Avg:98 F (36.7 C), Min:97.8 F (36.6 C), Max:98.3 F (36.8 C)  Recent Labs  Lab 05/21/21 0900  WBC 5.6  CREATININE 0.93    Estimated Creatinine Clearance: 67.1 mL/min (by C-G formula based on SCr of 0.93 mg/dL).    Allergies  Allergen Reactions   Penicillins Other (See Comments)    as child; not certain of reaction    Thank you for allowing pharmacy to be a part of this patient's care.  Sloan Leiter, PharmD, BCPS, BCCCP Clinical Pharmacist Please refer to [facility] for [place] numbers 05/22/2021 8:57 PM

## 2021-05-22 NOTE — H&P (Signed)
Subjective: The patient is a 75 year old white male who has had previous lumbar laminectomies in the past by other physicians.  He is developed acute left leg pain, numbness, tingling and weakness.  He failed medical management and was worked up with a lumbar MRI which demonstrated left L3-4 and 4 5 stenosis and herniated disc.  I discussed the various treatment options with him.  He has decided proceed with surgery.  Past Medical History:  Diagnosis Date   Allergy    Arthritis    BPH (benign prostatic hyperplasia)    Dizziness    Mild hyperlipidemia    Mononucleosis 11/09/1957   Rosacea    Wolf-Parkinson-White syndrome     Past Surgical History:  Procedure Laterality Date   back pain  2013   epidurals x2 for pain relief   COLONOSCOPY  2011   2006, no polyps   LAMINECTOMY  11/09/1974   microdiscectemy  02/1996   NASAL SEPTOPLASTY W/ TURBINOPLASTY  2007   uvelectemy   NASAL SEPTUM SURGERY  11/09/1981   SHOULDER SURGERY  11/09/1988   VASECTOMY  11/09/1980    Allergies  Allergen Reactions   Penicillins Other (See Comments)    as child; not certain of reaction    Social History   Tobacco Use   Smoking status: Never    Passive exposure: Past (parents quit when pt was 48)   Smokeless tobacco: Never  Substance Use Topics   Alcohol use: Yes    Alcohol/week: 2.0 standard drinks    Types: 2 Glasses of wine per week    Comment: socially    Family History  Problem Relation Age of Onset   Cancer Mother    Heart disease Mother    Cancer - Cervical Mother    Cancer Father    Prostate cancer Father    Colon cancer Neg Hx    Colon polyps Neg Hx    Esophageal cancer Neg Hx    Rectal cancer Neg Hx    Stomach cancer Neg Hx    Prior to Admission medications   Medication Sig Start Date End Date Taking? Authorizing Provider  cetirizine (ZYRTEC) 10 MG tablet Take 10 mg by mouth daily. Or Claritin   Yes [provider]  donepezil (ARICEPT) 10 MG tablet Take 10 mg by mouth  at bedtime.   Yes [provider]  finasteride (PROSCAR) 5 MG tablet Take 5 mg by mouth daily.   Yes [provider]  fluticasone (FLONASE) 50 MCG/ACT nasal spray Place 1 spray into both nostrils daily.   Yes [provider]  Multiple Vitamin (MULTI-VITAMIN PO) Take 1 tablet by mouth daily.   Yes [provider]  naproxen sodium (ALEVE) 220 MG tablet Take 220 mg by mouth 3 (three) times daily.   Yes [provider]  Omega-3 Fatty Acids (FISH OIL PO) Take 1,000 mg by mouth daily.   Yes [provider]  zolpidem (AMBIEN) 5 MG tablet Take 1 tablet (5 mg total) by mouth at bedtime as needed for sleep. 10/02/13  Yes Reed, Tiffany L, DO     Review of Systems  Positive ROS: As above  All other systems have been reviewed and were otherwise negative with the exception of those mentioned in the HPI and as above.  Objective: Vital signs in last 24 hours: Temp:  [98.3 F (36.8 C)] 98.3 F (36.8 C) (07/14 1433) Pulse Rate:  [60] 60 (07/14 1433) Resp:  [17] 17 (07/14 1433) BP: (160)/(69) 160/69 (07/14 1438)  SpO2:  [98 %] 98 % (07/14 1433) Weight:  [78.9 kg] 78.9 kg (07/14 1433) Estimated body mass index is 27.25 kg/m as calculated from the following:   Height as of this encounter: 5\' 7"  (1.702 m).   Weight as of this encounter: 78.9 kg.   General Appearance: Alert Head: Normocephalic, without obvious abnormality, atraumatic Eyes: PERRL, conjunctiva/corneas clear, EOM's intact,    Ears: Normal  Throat: Normal  Neck: Supple, Back: The patient's lumbar incision is well-healed. Lungs: Clear to auscultation bilaterally, respirations unlabored Heart: Regular rate and rhythm, no murmur, rub or gallop Abdomen: Soft, non-tender Extremities: Extremities normal, atraumatic, no cyanosis or edema Skin: unremarkable  NEUROLOGIC:   Mental status: alert and oriented,Motor Exam -he has weakness in his left quadricep Sensory Exam -he has numbness  in the left L4 distribution reflexes: The patient's left quadricep reflex is absent. Coordination - grossly normal Gait - grossly normal Balance - grossly normal Cranial Nerves: I: smell Not tested  II: visual acuity  OS: Normal  OD: Normal   II: visual fields Full to confrontation  II: pupils Equal, round, reactive to light  III,VII: ptosis None  III,IV,VI: extraocular muscles  Full ROM  V: mastication Normal  V: facial light touch sensation  Normal  V,VII: corneal reflex  Present  VII: facial muscle function - upper  Normal  VII: facial muscle function - lower Normal  VIII: hearing Not tested  IX: soft palate elevation  Normal  IX,X: gag reflex Present  XI: trapezius strength  5/5  XI: sternocleidomastoid strength 5/5  XI: neck flexion strength  5/5  XII: tongue strength  Normal    Data Review Lab Results  Component Value Date   WBC 5.6 05/21/2021   HGB 14.8 05/21/2021   HCT 46.1 05/21/2021   MCV 95.1 05/21/2021   PLT 293 05/21/2021   Lab Results  Component Value Date   NA 137 05/21/2021   K 4.6 05/21/2021   CL 103 05/21/2021   CO2 29 05/21/2021   BUN 16 05/21/2021   CREATININE 0.93 05/21/2021   GLUCOSE 99 05/21/2021   No results found for: INR, PROTIME  Assessment/Plan: Left L3-4 and L4-5 stenosis, synovial cyst, herniated disc, lumbar radiculopathy, lumbago: I have discussed the situation with the patient.  I reviewed his imaging studies with him and pointed out the abnormalities.  We have discussed the various treatment options including surgery.  I have described the surgical treatment option of a left L3-4 and L4-5 laminectomy/laminotomy and discectomy.  I have shown him surgical models.  I have given him a surgical pamphlet.  We have discussed the risk, benefits, alternatives, expected postoperative course, and likelihood of achieving our goals with surgery.  I have answered all his questions.  He has decided proceed with surgery.   Ophelia Charter 05/22/2021 4:46 PM

## 2021-05-22 NOTE — Anesthesia Postprocedure Evaluation (Signed)
Anesthesia Post Note  Patient: Chase [name]  Procedure(s) Performed: Left Lumbar Three-Four Lumbar Four-Five Microdiscectomy (Left: Spine Lumbar)     Patient location during evaluation: PACU Anesthesia Type: General Level of consciousness: awake and alert Pain management: pain level controlled Vital Signs Assessment: post-procedure vital signs reviewed and stable Respiratory status: spontaneous breathing, nonlabored ventilation, respiratory function stable and patient connected to nasal cannula oxygen Cardiovascular status: blood pressure returned to baseline and stable Postop Assessment: no apparent nausea or vomiting Anesthetic complications: no   No notable events documented.  Last Vitals:  Vitals:   05/22/21 2050 05/22/21 2104  BP: 135/75 131/72  Pulse: 65 (!) 57  Resp: 17 18  Temp: 36.6 C (!) 36.3 C  SpO2: 98% 97%    Last Pain:  Vitals:   05/22/21 2104  TempSrc: Oral  PainSc:                  March Rummage Teran Knittle

## 2021-05-23 ENCOUNTER — Encounter (HOSPITAL_COMMUNITY): Payer: Self-pay | Admitting: Neurosurgery

## 2021-05-23 DIAGNOSIS — R2681 Unsteadiness on feet: Secondary | ICD-10-CM | POA: Diagnosis not present

## 2021-05-23 DIAGNOSIS — M7138 Other bursal cyst, other site: Secondary | ICD-10-CM | POA: Diagnosis not present

## 2021-05-23 DIAGNOSIS — Z79899 Other long term (current) drug therapy: Secondary | ICD-10-CM | POA: Diagnosis not present

## 2021-05-23 DIAGNOSIS — M5116 Intervertebral disc disorders with radiculopathy, lumbar region: Secondary | ICD-10-CM | POA: Diagnosis not present

## 2021-05-23 DIAGNOSIS — Z880 Allergy status to penicillin: Secondary | ICD-10-CM | POA: Diagnosis not present

## 2021-05-23 DIAGNOSIS — M48061 Spinal stenosis, lumbar region without neurogenic claudication: Secondary | ICD-10-CM | POA: Diagnosis not present

## 2021-05-23 MED ORDER — DOCUSATE SODIUM 100 MG PO CAPS
100.0000 mg | ORAL_CAPSULE | Freq: Two times a day (BID) | ORAL | 0 refills | Status: DC
Start: 2021-05-23 — End: 2021-08-13

## 2021-05-23 MED ORDER — CYCLOBENZAPRINE HCL 10 MG PO TABS: 10.0000 mg | ORAL_TABLET | Freq: Three times a day (TID) | ORAL | 0 refills | Status: DC | PRN

## 2021-05-23 MED ORDER — OXYCODONE-ACETAMINOPHEN 5-325 MG PO TABS: 1.0000 | ORAL_TABLET | ORAL | Status: DC | PRN

## 2021-05-23 MED ORDER — OXYCODONE-ACETAMINOPHEN 5-325 MG PO TABS: 1.0000 | ORAL_TABLET | ORAL | 0 refills | Status: DC | PRN

## 2021-05-23 NOTE — Evaluation (Signed)
Occupational Therapy Evaluation Patient Details Name: Chase [name] MRN: [id number] DOB: Mar 16, 1946 Today's Date: 05/23/2021    History of Present Illness Pt is a 75 y/o male who presents s/p Left L4-5 discectomy with resection of herniated disc, left L3-4 laminotomy/foraminotomies to remove cervical cyst on 05/22/2021. PMH significant for Wolf-Parkinson-White syndrome, prior back surgery, shoulder surgery 1990.   Clinical Impression   Pt admitted for procedure listed above. 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 Pt presented this session with continued independence with all ADL's, completing functional mobility safely with no assist, and able to recall all back precautions with no assist. Pt does not need OT services at this time and will be discharged from OT.    Follow Up Recommendations  No OT follow up    Equipment Recommendations  None recommended by OT    Recommendations for Other Services       Precautions / Restrictions Precautions Precautions: Fall;Back Precaution Booklet Issued: Yes (comment) Precaution Comments: Reviewed handout and pt was cued for precautions during functional mobility. Required Braces or Orthoses:  ("No brace needed" order1) Restrictions Weight Bearing Restrictions: No      Mobility Bed Mobility Overal bed mobility: Modified Independent             General bed mobility comments: HOB slightly elevated and rails lowered to simulate home environment.    Transfers Overall transfer level: Modified independent Equipment used: None             General transfer comment: VC's for improved posture during sit<>stand. Encouraged wide BOS to encourage neutral spine during squat.    Balance Overall balance assessment: No apparent balance deficits (not formally assessed)                                         ADL either performed or assessed with clinical  judgement   ADL Overall ADL's : At baseline;Independent                                       General ADL Comments: Pt able to get dressed, groom standing at the sink, and toilet safely with no assist.     Vision Baseline Vision/History: Wears glasses Wears Glasses: At all times Patient Visual Report: No change from baseline Vision Assessment?: No apparent visual deficits     Perception Perception Perception Tested?: No   Praxis Praxis Praxis tested?: Not tested    Pertinent Vitals/Pain Pain Assessment: Faces Faces Pain Scale: Hurts a little bit Pain Location: Incision site Pain Descriptors / Indicators: Operative site guarding Pain Intervention(s): Limited activity within patient's tolerance     Hand Dominance Right   Extremity/Trunk Assessment Upper Extremity Assessment Upper Extremity Assessment: Overall WFL for tasks assessed   Lower Extremity Assessment Lower Extremity Assessment: Defer to PT evaluation LLE Deficits / Details: Decreased strength and muscular endurance consistent with pre-op diagnosis   Cervical / Trunk Assessment Cervical / Trunk Assessment: Other exceptions Cervical / Trunk Exceptions: s/p surgery   Communication Communication Communication: No difficulties   Cognition Arousal/Alertness: Awake/alert Behavior During Therapy: WFL for tasks assessed/performed Overall Cognitive Status: Within Functional Limits for tasks assessed  General Comments  VSS on RA    Exercises     Shoulder Instructions      Home Living Family/patient expects to be discharged to:: Private residence Living Arrangements: Spouse/significant other Available Help at Discharge: Family Type of Home: House Home Access: Level entry     Home Layout: Multi-level;Able to live on main level with bedroom/bathroom Alternate Level Stairs-Number of Steps: flight Alternate Level Stairs-Rails:  Right;Left Bathroom Shower/Tub: Occupational psychologist: Standard Bathroom Accessibility: Yes How Accessible: Accessible via walker Home Equipment: None          Prior Functioning/Environment Level of Independence: Independent                 OT Problem List: Decreased strength;Decreased activity tolerance;Pain      OT Treatment/Interventions:      OT Goals(Current goals can be found in the care plan section) Acute Rehab OT Goals Patient Stated Goal: Home ASAP, get back to work and exercising OT Goal Formulation: All assessment and education complete, DC therapy Time For Goal Achievement: 05/23/21 Potential to Achieve Goals: Good  OT Frequency:     Barriers to D/C:            Co-evaluation              AM-PAC OT "6 Clicks" Daily Activity     Outcome Measure Help from another person eating meals?: None Help from another person taking care of personal grooming?: None Help from another person toileting, which includes using toliet, bedpan, or urinal?: None Help from another person bathing (including washing, rinsing, drying)?: None Help from another person to put on and taking off regular upper body clothing?: None Help from another person to put on and taking off regular lower body clothing?: None 6 Click Score: 24   End of Session Nurse Communication: Mobility status  Activity Tolerance: Patient tolerated treatment well Patient left: in chair;with call bell/phone within reach  OT Visit Diagnosis: Unsteadiness on feet (R26.81);Muscle weakness (generalized) (M62.81)                Time: 2957-4734 OT Time Calculation (min): 19 min Charges:  OT General Charges $OT Visit: 1 Visit OT Evaluation $OT Eval Low Complexity: [place]., OTR/L Acute Rehabilitation  Quill Grinder Elane Yolanda Bonine 05/23/2021, 9:39 AM

## 2021-05-23 NOTE — Discharge Summary (Signed)
Physician Discharge Summary  Patient ID: Chase [name] MRN: [id number] DOB/AGE: [birth year] 75 y.o.  Admit date: 05/22/2021 Discharge date: 05/23/2021  Admission Diagnoses: Lumbar synovial cyst, lumbar spinal stenosis, lumbar disc, lumbar radiculopathy, lumbago  Discharge Diagnoses: The same Active Problems:   Lumbar herniated disc   Discharged Condition: good  Hospital Course: I performed a left L3-4 and L4-5 laminotomy/laminectomy/foraminotomies/discectomy on the patient on 05/22/2021.  The surgery went well.  The patient's postoperative course was unremarkable.  On postoperative day #1 he requested discharge home.  He was given written and oral discharge instructions.  All his questions were answered.  Consults: PT, OT, care management Significant Diagnostic Studies: None Treatments: Left L3-4 and L4-5 laminectomy/laminotomy/foraminotomies/discectomy Discharge Exam: Blood pressure (!) 101/55, pulse (!) 58, temperature 98.2 F (36.8 C), temperature source Oral, resp. rate 18, height 5\' 7"  (1.702 m), weight 78.9 kg, SpO2 95 %. The patient is alert and pleasant.  He looks well.  His dressing is clean and dry his.  His strength is normal.  Disposition: Home  Discharge Instructions     Call MD for:  difficulty breathing, headache or visual disturbances   Complete by: As directed    Call MD for:  extreme fatigue   Complete by: As directed    Call MD for:  hives   Complete by: As directed    Call MD for:  persistant dizziness or light-headedness   Complete by: As directed    Call MD for:  persistant nausea and vomiting   Complete by: As directed    Call MD for:  redness, tenderness, or signs of infection (pain, swelling, redness, odor or green/yellow discharge around incision site)   Complete by: As directed    Call MD for:  severe uncontrolled pain   Complete by: As directed    Call MD for:  temperature >100.4   Complete by: As directed    Diet - low sodium heart healthy    Complete by: As directed    Discharge instructions   Complete by: As directed    Call [phone] for a followup appointment. Take a stool softener while you are using pain medications.   Driving Restrictions   Complete by: As directed    Do not drive for 2 weeks.   Increase activity slowly   Complete by: As directed    Lifting restrictions   Complete by: As directed    Do not lift more than 5 pounds. No excessive bending or twisting.   May shower / Bathe   Complete by: As directed    Remove the dressing for 3 days after surgery.  You may shower, but leave the incision alone.   Remove dressing in 48 hours   Complete by: As directed       Allergies as of 05/23/2021       Reactions   Penicillins Other (See Comments)   as child; not certain of reaction        Medication List     TAKE these medications    cetirizine 10 MG tablet Commonly known as: ZYRTEC Take 10 mg by mouth daily. Or Claritin   cyclobenzaprine 10 MG tablet Commonly known as: FLEXERIL Take 1 tablet (10 mg total) by mouth 3 (three) times daily as needed for muscle spasms.   docusate sodium 100 MG capsule Commonly known as: COLACE Take 1 capsule (100 mg total) by mouth 2 (two) times daily.   donepezil 10 MG tablet Commonly known as: ARICEPT Take 10 mg by  mouth at bedtime.   finasteride 5 MG tablet Commonly known as: PROSCAR Take 5 mg by mouth daily.   FISH OIL PO Take 1,000 mg by mouth daily.   fluticasone 50 MCG/ACT nasal spray Commonly known as: FLONASE Place 1 spray into both nostrils daily.   MULTI-VITAMIN PO Take 1 tablet by mouth daily.   naproxen sodium 220 MG tablet Commonly known as: ALEVE Take 220 mg by mouth 3 (three) times daily.   oxyCODONE-acetaminophen 5-325 MG tablet Commonly known as: PERCOCET/ROXICET Take 1-2 tablets by mouth every 4 (four) hours as needed for moderate pain.   zolpidem 5 MG tablet Commonly known as: AMBIEN Take 1 tablet (5 mg total) by mouth at  bedtime as needed for sleep.         Signed: Ophelia Charter 05/23/2021, 6:57 AM

## 2021-05-23 NOTE — Plan of Care (Addendum)
Patient discharged to home this AM. Patient denies questions or concerns at this time. Patient ambulated off unit with NT at 0930 hrs.   Problem: Education: Goal: Ability to verbalize activity precautions or restrictions will improve 05/23/2021 0927 by Jesse Sans, RN Outcome: Adequate for Discharge 05/23/2021 0926 by Jesse Sans, RN Outcome: Adequate for Discharge Goal: Knowledge of the prescribed therapeutic regimen will improve 05/23/2021 0927 by Jesse Sans, RN Outcome: Adequate for Discharge 05/23/2021 0926 by Jesse Sans, RN Outcome: Adequate for Discharge Goal: Understanding of discharge needs will improve 05/23/2021 0927 by Jesse Sans, RN Outcome: Adequate for Discharge 05/23/2021 0926 by Jesse Sans, RN Outcome: Adequate for Discharge   Problem: Activity: Goal: Ability to avoid complications of mobility impairment will improve 05/23/2021 0927 by Jesse Sans, RN Outcome: Adequate for Discharge 05/23/2021 0926 by Jesse Sans, RN Outcome: Adequate for Discharge Goal: Ability to tolerate increased activity will improve 05/23/2021 0927 by Jesse Sans, RN Outcome: Adequate for Discharge 05/23/2021 0926 by Jesse Sans, RN Outcome: Adequate for Discharge Goal: Will remain free from falls 05/23/2021 0927 by Jesse Sans, RN Outcome: Adequate for Discharge 05/23/2021 0926 by Jesse Sans, RN Outcome: Adequate for Discharge   Problem: Bowel/Gastric: Goal: Gastrointestinal status for postoperative course will improve 05/23/2021 0927 by Jesse Sans, RN Outcome: Adequate for Discharge 05/23/2021 0926 by Jesse Sans, RN Outcome: Adequate for Discharge   Problem: Clinical Measurements: Goal: Ability to maintain clinical measurements within normal limits will improve 05/23/2021 0927 by Jesse Sans, RN Outcome: Adequate for Discharge 05/23/2021 0926 by Jesse Sans, RN Outcome: Adequate for Discharge Goal: Postoperative  complications will be avoided or minimized 05/23/2021 0927 by Jesse Sans, RN Outcome: Adequate for Discharge 05/23/2021 0926 by Jesse Sans, RN Outcome: Adequate for Discharge Goal: Diagnostic test results will improve 05/23/2021 0927 by Jesse Sans, RN Outcome: Adequate for Discharge 05/23/2021 0926 by Jesse Sans, RN Outcome: Adequate for Discharge   Problem: Activity: Goal: Ability to avoid complications of mobility impairment will improve 05/23/2021 0927 by Jesse Sans, RN Outcome: Adequate for Discharge 05/23/2021 0926 by Jesse Sans, RN Outcome: Adequate for Discharge Goal: Ability to tolerate increased activity will improve 05/23/2021 0927 by Jesse Sans, RN Outcome: Adequate for Discharge 05/23/2021 0926 by Jesse Sans, RN Outcome: Adequate for Discharge Goal: Will remain free from falls 05/23/2021 0927 by Jesse Sans, RN Outcome: Adequate for Discharge 05/23/2021 0926 by Jesse Sans, RN Outcome: Adequate for Discharge   Problem: Clinical Measurements: Goal: Ability to maintain clinical measurements within normal limits will improve 05/23/2021 0927 by Jesse Sans, RN Outcome: Adequate for Discharge 05/23/2021 0926 by Jesse Sans, RN Outcome: Adequate for Discharge Goal: Postoperative complications will be avoided or minimized 05/23/2021 0927 by Jesse Sans, RN Outcome: Adequate for Discharge 05/23/2021 0926 by Jesse Sans, RN Outcome: Adequate for Discharge Goal: Diagnostic test results will improve 05/23/2021 0927 by Jesse Sans, RN Outcome: Adequate for Discharge 05/23/2021 0926 by Jesse Sans, RN Outcome: Adequate for Discharge   Problem: Pain Management: Goal: Pain level will decrease 05/23/2021 0927 by Jesse Sans, RN Outcome: Adequate for Discharge 05/23/2021 0926 by Jesse Sans, RN Outcome: Adequate for Discharge

## 2021-05-23 NOTE — Evaluation (Signed)
Physical Therapy Evaluation and Discharge  Patient Details Name: Chase [name] MRN: [id number] DOB: [birth year] Today's Date: 05/23/2021   History of Present Illness  Pt is a 75 y/o male who presents s/p Left L4-5 discectomy with resection of herniated disc, left L3-4 laminotomy/foraminotomies to remove cervical cyst on 05/22/2021. PMH significant for Wolf-Parkinson-White syndrome, prior back surgery, shoulder surgery 1990.   Clinical Impression  Patient evaluated by Physical Therapy with no further acute PT needs identified. All education has been completed and the patient has no further questions. Pt was able to demonstrate transfers and ambulation with gross modified independence and no AD. Pt was educated on precautions, positioning recommendations, appropriate activity progression, and car transfer. See below for any follow-up Physical Therapy or equipment needs. PT is signing off. Thank you for this referral.     Follow Up Recommendations No PT follow up;Supervision - Intermittent    Equipment Recommendations  None recommended by PT    Recommendations for Other Services       Precautions / Restrictions Precautions Precautions: Fall;Back Precaution Booklet Issued: Yes (comment) Precaution Comments: Reviewed handout and pt was cued for precautions during functional mobility. Required Braces or Orthoses:  ("No brace needed" order) Restrictions Weight Bearing Restrictions: No      Mobility  Bed Mobility Overal bed mobility: Modified Independent             General bed mobility comments: HOB slightly elevated and rails lowered to simulate home environment.    Transfers Overall transfer level: Modified independent Equipment used: None             General transfer comment: VC's for improved posture during sit<>stand. Encouraged wide BOS to encourage neutral spine during squat.  Ambulation/Gait Ambulation/Gait assistance: Modified independent (Device/Increase  time) Gait Distance (Feet): 450 Feet Assistive device: None Gait Pattern/deviations: Step-through pattern;Decreased stride length Gait velocity: Decreased Gait velocity interpretation: <1.31 ft/sec, indicative of household ambulator General Gait Details: Pt ambulating well with minimal gait deviations. No assist required and no LOB noted.  Stairs Stairs: Yes Stairs assistance: Modified independent (Device/Increase time) Stair Management: One rail Right;Step to pattern;Forwards Number of Stairs: 10 General stair comments: No usnteadiness noted and no L quad instability.  Wheelchair Mobility    Modified Rankin (Stroke Patients Only)       Balance Overall balance assessment: No apparent balance deficits (not formally assessed)                                           Pertinent Vitals/Pain Pain Assessment: Faces Faces Pain Scale: Hurts a little bit Pain Location: Incision site Pain Descriptors / Indicators: Operative site guarding Pain Intervention(s): Limited activity within patient's tolerance;Monitored during session;Repositioned    Home Living Family/patient expects to be discharged to:: Private residence Living Arrangements: Spouse/significant other Available Help at Discharge: Family Type of Home: House Home Access: Level entry     Home Layout: Multi-level;Able to live on main level with bedroom/bathroom Home Equipment: None      Prior Function Level of Independence: Independent               Hand Dominance        Extremity/Trunk Assessment   Upper Extremity Assessment Upper Extremity Assessment: Overall WFL for tasks assessed    Lower Extremity Assessment Lower Extremity Assessment: LLE deficits/detail LLE Deficits / Details: Decreased strength and muscular endurance consistent with pre-op diagnosis  Cervical / Trunk Assessment Cervical / Trunk Assessment: Other exceptions Cervical / Trunk Exceptions: s/p surgery   Communication   Communication: No difficulties  Cognition Arousal/Alertness: Awake/alert Behavior During Therapy: WFL for tasks assessed/performed Overall Cognitive Status: Within Functional Limits for tasks assessed                                        General Comments      Exercises     Assessment/Plan    PT Assessment Patent does not need any further PT services  PT Problem List         PT Treatment Interventions      PT Goals (Current goals can be found in the Care Plan section)  Acute Rehab PT Goals Patient Stated Goal: Home ASAP, get back to work and exercising PT Goal Formulation: All assessment and education complete, DC therapy    Frequency     Barriers to discharge        Co-evaluation               AM-PAC PT "6 Clicks" Mobility  Outcome Measure Help needed turning from your back to your side while in a flat bed without using bedrails?: None Help needed moving from lying on your back to sitting on the side of a flat bed without using bedrails?: None Help needed moving to and from a bed to a chair (including a wheelchair)?: None Help needed standing up from a chair using your arms (e.g., wheelchair or bedside chair)?: None Help needed to walk in hospital room?: None Help needed climbing 3-5 steps with a railing? : None 6 Click Score: 24    End of Session Equipment Utilized During Treatment: Gait belt Activity Tolerance: Patient tolerated treatment well Patient left:  (Sitting EOB with OT present) Nurse Communication: Mobility status PT Visit Diagnosis: Unsteadiness on feet (R26.81);Pain Pain - part of body:  (back)    Time: 6270-3500 PT Time Calculation (min) (ACUTE ONLY): 23 min   Charges:   PT Evaluation $PT Eval Low Complexity: 1 Low PT Treatments $Gait Training: 8-22 mins        Rolinda Roan, PT, DPT Acute Rehabilitation Services Pager: [phone] Office: [phone]   Thelma Comp 05/23/2021, 9:21  AM

## 2021-05-27 ENCOUNTER — Encounter: Payer: Medicare Other | Admitting: Gastroenterology

## 2021-05-29 ENCOUNTER — Encounter: Payer: Medicare Other | Admitting: Gastroenterology

## 2021-08-07 DIAGNOSIS — Z23 Encounter for immunization: Secondary | ICD-10-CM | POA: Diagnosis not present

## 2021-08-09 HISTORY — PX: COLONOSCOPY: SHX174

## 2021-08-13 ENCOUNTER — Ambulatory Visit (AMBULATORY_SURGERY_CENTER): Payer: Medicare Other | Admitting: *Deleted

## 2021-08-13 ENCOUNTER — Other Ambulatory Visit: Payer: Self-pay

## 2021-08-13 VITALS — Ht 67.0 in | Wt 170.0 lb

## 2021-08-13 DIAGNOSIS — Z1211 Encounter for screening for malignant neoplasm of colon: Secondary | ICD-10-CM

## 2021-08-13 NOTE — Progress Notes (Signed)
No egg or soy allergy known to patient  No issues known to pt with past sedation with any surgeries or procedures Patient denies ever being told they had issues or difficulty with intubation  No FH of Malignant Hyperthermia Pt is not on diet pills Pt is not on  home 02  Pt is not on blood thinners  Pt denies issues with constipation  No A fib or A flutter  Pt is fully vaccinated  for Covid  Due to the COVID-19 pandemic we are asking patients to follow certain guidelines in PV and the [facility]   Pt aware of COVID protocols and [facility] guidelines   Pt has a Golytely prep at home from 05-2021 PV

## 2021-08-14 ENCOUNTER — Ambulatory Visit: Payer: Medicare Other | Attending: Internal Medicine

## 2021-08-14 DIAGNOSIS — M5442 Lumbago with sciatica, left side: Secondary | ICD-10-CM | POA: Insufficient documentation

## 2021-08-14 DIAGNOSIS — G8929 Other chronic pain: Secondary | ICD-10-CM | POA: Diagnosis not present

## 2021-08-14 DIAGNOSIS — M6281 Muscle weakness (generalized): Secondary | ICD-10-CM | POA: Diagnosis not present

## 2021-08-14 DIAGNOSIS — R262 Difficulty in walking, not elsewhere classified: Secondary | ICD-10-CM | POA: Insufficient documentation

## 2021-08-14 NOTE — Therapy (Signed)
[place], [place], [address] Phone: [phone]   Fax:  [phone]  Physical Therapy Evaluation  Patient Details  Name: Chase [name] MRN: [id number] Date of Birth: 12/28/1945 Referring Provider (PT): Frederich Cha, MD   Encounter Date: 08/14/2021   PT End of Session - 08/14/21 0923     Visit Number 1    Number of Visits 7    Date for PT Re-Evaluation 10/11/21    Authorization Type MEDICARE PART A AND B;BCBS SUPPLEMENT    PT Start Time 0720    PT Stop Time 0810    PT Time Calculation (min) 50 min    Activity Tolerance Patient tolerated treatment well    Behavior During Therapy [facility] for tasks assessed/performed             Past Medical History:  Diagnosis Date   Allergy    Arthritis    BPH (benign prostatic hyperplasia)    Dizziness    Mild hyperlipidemia    Mononucleosis 11/09/1957   Rosacea    Wolf-Parkinson-White syndrome    30 years ago    Past Surgical History:  Procedure Laterality Date   back pain  2013   epidurals x2 for pain relief   COLONOSCOPY  2011   2006, no polyps   COLONOSCOPY     LAMINECTOMY  11/09/1974   LUMBAR LAMINECTOMY/DECOMPRESSION MICRODISCECTOMY Left 05/22/2021   Procedure: Left Lumbar Three-Four Lumbar Four-Five Microdiscectomy;  Surgeon: Newman Pies, MD;  Location: [place];  Service: Neurosurgery;  Laterality: Left;   microdiscectemy  02/1996   NASAL SEPTOPLASTY W/ TURBINOPLASTY  2007   uvelectemy   NASAL SEPTUM SURGERY  11/09/1981   SHOULDER SURGERY  11/09/1988   VASECTOMY  11/09/1980    There were no vitals filed for this visit.    Subjective Assessment - 08/14/21 0727     Subjective Pt reports undergoing back surgery 05/22/21. Pt notes the surgery went very well with the pain being resolved. Prior to surgery pt reports having significant L quad weakness which caused 3 falls. Pt denies falls since surgery, but notes a tightness sensation and  weakness of the quad, his walking pattern feels off, he experiences occasional L hip pain, and has L lateral thigh c crossing L LE over the R. Pt states he is using a handrail when ascending and descending for safety due to concern his L LE may give out. Pt reports he is walking 5 miles a day.    Pertinent History LUMBAR LAMINECTOMY/DECOMPRESSION MICRODISCECTOMY- 05/22/21    Patient Stated Goals To strengthening the L LE so I feel more confident with walking and asc/dsc steps    Currently in Pain? Yes    Pain Score 0-No pain   intermittent L lateral thigh pain   Pain Location Hip    Pain Orientation Lateral;Left    Pain Descriptors / Indicators Aching    Pain Type Chronic pain    Aggravating Factors  Crossing L LE over the R    Pain Relieving Factors Advil 200 mg in the AM and maybe later that day as needed                [facility] PT Assessment - 08/14/21 0001       Assessment   Medical Diagnosis Lumbago with sciatica, left side    Referring Provider (PT) Frederich Cha, MD    Onset Date/Surgical Date 05/22/21    Next MD Visit End of October    Prior  Therapy not since 7/14/ surgery      Precautions   Precautions None      Restrictions   Weight Bearing Restrictions No      Balance Screen   Has the patient fallen in the past 6 months Yes    How many times? 3- L LE weakness prior to surgery    Has the patient had a decrease in activity level because of a fear of falling?  No    Is the patient reluctant to leave their home because of a fear of falling?  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    Leisure Enjoys yard work, walking      Cognition   Overall Cognitive Status Within Functional Limits for tasks assessed      Sensation   Light Touch --   decreased for LT along the L lateral calf     Posture/Postural Control   Posture/Postural Control Postural limitations    Postural Limitations Forward head;Rounded Shoulders;Increased thoracic kyphosis;Decreased lumbar lordosis      ROM / Strength   AROM / PROM / Strength AROM;Strength      AROM   AROM Assessment Site Lumbar    Lumbar Flexion mod decrease ROM, hamstring tightness    Lumbar Extension min decrease in ROM    Lumbar - Right Side Bend mod decrease ROM    Lumbar - Left Side Bend mod decrease ROM    Lumbar - Right Rotation WNLs    Lumbar - Left Rotation WNLs      Strength   Strength Assessment Site Hip;Knee;Ankle    Right/Left Hip Right;Left    Right Hip Flexion 5/5    Right Hip Extension 5/5    Right Hip External Rotation  5/5    Right Hip Internal Rotation 5/5    Right Hip ABduction 5/5    Right Hip ADduction 5/5    Left Hip Flexion 4+/5    Left Hip Extension 4+/5    Left Hip External Rotation 4+/5    Left Hip Internal Rotation 5/5    Left Hip ABduction 4/5    Right/Left Knee Right;Left    Right Knee Flexion 5/5    Right Knee Extension 5/5    Left Knee Flexion 4+/5    Left Knee Extension 4+/5    Right/Left Ankle Right;Left    Right Ankle Dorsiflexion 5/5    Right Ankle Plantar Flexion 5/5    Left Ankle Dorsiflexion 4+/5    Left Ankle Plantar Flexion 4+/5      Flexibility   Soft Tissue Assessment /Muscle Length yes    Hamstrings Bilat 50d    ITB WNLs      Special Tests   Other special tests Thomas test - neg      Transfers   Transfers Sit to Stand;Stand to Sit    Sit to Stand 7: Independent      Ambulation/Gait   Gait Pattern Within Functional Limits;Step-through pattern   out toeing bilat  Stairs Yes    Stairs Assistance 6: Modified independent (Device/Increase  time)   Use of handrail for support                       Objective measurements completed on examination: See above findings.                PT Education - 08/14/21 0921     Education Details Eval findings, POC, HEP for lumbopelvic flexibility and strengthening    Person(s) Educated Patient    Methods Explanation;Demonstration;Tactile cues;Handout;Verbal cues    Comprehension Verbalized understanding;Returned demonstration;Verbal cues required;Tactile cues required              PT Short Term Goals - 08/14/21 0938       PT SHORT TERM GOAL #1   Title STGs=LTGs               PT Long Term Goals - 08/14/21 0938       PT LONG TERM GOAL #1   Title Increase L LE ankle and knee strength to 5/5 and L hip strength to 4+/5 for improved functional mobility and safet with asc/dsc steps    Baseline see flowsheets    Status New    Target Date 10/11/21      PT LONG TERM GOAL #2   Title Increase bilat hamstring flexibility to 60d for reduced low back strain and improved trunk mobility    Baseline Bilat 50d    Status New    Target Date 10/11/21      PT LONG TERM GOAL #3   Title Pt will be able to asc/dsc a flight of steps with an alternating pattern and without the use of a handrail as demonstration of improved L LE strength and improved functional mobility    Status New    Target Date 10/11/21      PT LONG TERM GOAL #4   Title Pt will be ind in a final HEP to maintain achieved LOF    Status New    Target Date 10/11/21                    Plan - 08/14/21 0908     Clinical Impression Statement Pt present 12 weeks following LUMBAR LAMINECTOMY/DECOMPRESSION MICRODISCECTOMY. Pt has responded very well re: pain and overall function, but reports continuing to experience weakness of the L quad/LE with intermittent discomfort which affects his preceived quality of gait and stability with asc/dsc steps. Pt notes he is still using a handrail with  steps to insure safety. PT assessment revealed weakness of the L LE, most significantly of the hip. See education for today's intervention. Pt will benefit from PT 1w6 to address lumbopelvic/LE flexibility and strength to improve pt's functional mobility and safety.    Personal Factors and Comorbidities Comorbidity 1    Comorbidities LUMBAR LAMINECTOMY/DECOMPRESSION MICRODISCECTOMY- 05/22/21; 1997 microdisctomy; 1976 laminectomy    Examination-Activity Limitations Locomotion Level;Stairs    Clinical Decision Making Low    Rehab Potential Good    PT Frequency 1x / week    PT Duration 6 weeks    PT Treatment/Interventions ADLs/Self Care Home Management;Aquatic Therapy;Electrical Stimulation;Moist Heat;Therapeutic exercise;Therapeutic activities;Functional mobility training;Balance training;Stair training;Gait training;Patient/family education;Manual techniques;Dry needling;Passive range of motion;Taping    PT Next Visit Plan Assess response to HEP, progress ther ex as indicated, assess 5xSTS and gait pattern and set goals as indicated    PT [place]  and Agree with Plan of Care Patient             Patient will benefit from skilled therapeutic intervention in order to improve the following deficits and impairments:  Difficulty walking, Decreased activity tolerance, Pain, Decreased strength  Visit Diagnosis: Chronic bilateral low back pain with left-sided sciatica  Muscle weakness (generalized)  Difficulty in walking, not elsewhere classified     Problem List Patient Active Problem List   Diagnosis Date Noted   Lumbar herniated disc 05/22/2021   Hyperlipidemia LDL goal <130 11/20/2014   Rhinitis, allergic 11/20/2014   Spinal stenosis, lumbar region, without neurogenic claudication 08/30/2013   Other and unspecified hyperlipidemia 08/30/2013   Routine general medical examination at a health care facility 08/29/2013   Wolff-Parkinson-White syndrome     Concealed WPW (Wolff-Parkinson-White) syndrome 09/18/2011   Gar Ponto MS, PT 08/14/21 9:58 AM   [facility] [address] [place], [place], [address] Phone: [phone]   Fax:  [phone]  Name: Noal Abshier MRN: [id number] Date of Birth: 1946-03-30

## 2021-08-27 ENCOUNTER — Encounter: Payer: Self-pay | Admitting: Gastroenterology

## 2021-08-27 ENCOUNTER — Other Ambulatory Visit: Payer: Self-pay

## 2021-08-27 ENCOUNTER — Ambulatory Visit (AMBULATORY_SURGERY_CENTER): Payer: Medicare Other | Admitting: Gastroenterology

## 2021-08-27 VITALS — BP 113/67 | HR 63 | Temp 98.4°F | Resp 14 | Ht 67.0 in | Wt 170.0 lb

## 2021-08-27 DIAGNOSIS — K5989 Other specified functional intestinal disorders: Secondary | ICD-10-CM | POA: Diagnosis not present

## 2021-08-27 DIAGNOSIS — Z86010 Personal history of colonic polyps: Secondary | ICD-10-CM

## 2021-08-27 DIAGNOSIS — K6289 Other specified diseases of anus and rectum: Secondary | ICD-10-CM

## 2021-08-27 DIAGNOSIS — D120 Benign neoplasm of cecum: Secondary | ICD-10-CM | POA: Diagnosis not present

## 2021-08-27 DIAGNOSIS — Z1211 Encounter for screening for malignant neoplasm of colon: Secondary | ICD-10-CM | POA: Diagnosis not present

## 2021-08-27 MED ORDER — SODIUM CHLORIDE 0.9 % IV SOLN: 500.0000 mL | Freq: Once | INTRAVENOUS | Status: DC

## 2021-08-27 NOTE — Progress Notes (Signed)
VS taken by C.W. 

## 2021-08-27 NOTE — Progress Notes (Signed)
Called to room to assist during endoscopic procedure.  Patient ID and intended procedure confirmed with present staff. Received instructions for my participation in the procedure from the performing physician.  

## 2021-08-27 NOTE — Progress Notes (Signed)
PT taken to PACU. Monitors in place. VSS. Report given to RN. 

## 2021-08-27 NOTE — Patient Instructions (Signed)
Read all of the handouts given to you by your recovery room nurse.  You will need another colonoscopy in 6 months.  We will send you a reminder.  YOU HAD AN ENDOSCOPIC PROCEDURE TODAY AT [facility]:   Refer to the procedure report that was given to you for any specific questions about what was found during the examination.  If the procedure report does not answer your questions, please call your gastroenterologist to clarify.  If you requested that your care partner not be given the details of your procedure findings, then the procedure report has been included in a sealed envelope for you to review at your convenience later.  YOU SHOULD EXPECT: Some feelings of bloating in the abdomen. Passage of more gas than usual.  Walking can help get rid of the air that was put into your GI tract during the procedure and reduce the bloating. If you had a lower endoscopy (such as a colonoscopy or flexible sigmoidoscopy) you may notice spotting of blood in your stool or on the toilet paper. If you underwent a bowel prep for your procedure, you may not have a normal bowel movement for a few days.  Please Note:  You might notice some irritation and congestion in your nose or some drainage.  This is from the oxygen used during your procedure.  There is no need for concern and it should clear up in a day or so.  SYMPTOMS TO REPORT IMMEDIATELY:  Following lower endoscopy (colonoscopy or flexible sigmoidoscopy):  Excessive amounts of blood in the stool  Significant tenderness or worsening of abdominal pains  Swelling of the abdomen that is new, acute  Fever of 100F or higher   For urgent or emergent issues, a gastroenterologist can be reached at any hour by calling [phone]. Do not use MyChart messaging for urgent concerns.    DIET:  We do recommend a small meal at first, but then you may proceed to your regular diet.  Drink plenty of fluids but you should avoid alcoholic beverages for 24  hours. Try to increase the fiber in your diet, and drink plenty of water.  Cut back on red meat.  ACTIVITY:  You should plan to take it easy for the rest of today and you should NOT DRIVE or use heavy machinery until tomorrow (because of the sedation medicines used during the test).    FOLLOW UP: Our staff will call the number listed on your records 48-72 hours following your procedure to check on you and address any questions or concerns that you may have regarding the information given to you following your procedure. If we do not reach you, we will leave a message.  We will attempt to reach you two times.  During this call, we will ask if you have developed any symptoms of COVID 19. If you develop any symptoms (ie: fever, flu-like symptoms, shortness of breath, cough etc.) before then, please call [phone].  If you test positive for Covid 19 in the 2 weeks post procedure, please call and report this information to [place].    If any biopsies were taken you will be contacted by phone or by letter within the next 1-3 weeks.  Please call us at [phone] if you have not heard about the biopsies in 3 weeks.    SIGNATURES/CONFIDENTIALITY: You and/or your care partner have signed paperwork which will be entered into your electronic medical record.  These signatures attest to the fact that that  the information above on your After Visit Summary has been reviewed and is understood.  Full responsibility of the confidentiality of this discharge information lies with you and/or your care-partner.

## 2021-08-27 NOTE — Op Note (Signed)
[facility] Patient Name: Chase [name] Procedure Date: 08/27/2021 9:37 AM MRN: [id number] Endoscopist: Thornton Park MD, MD Age: 75 Referring MD:  Date of Birth: 10-25-1946 Gender: Male Account #: [id number] Procedure:                Colonoscopy Indications:              Screening for colorectal malignant neoplasm                           Sigmoid hyperplastic polyps on colonoscopy in 2001,                            2006, and most recently in 2011 Medicines:                Monitored Anesthesia Care Procedure:                Pre-Anesthesia Assessment:                           - Prior to the procedure, a History and Physical                            was performed, and patient medications and                            allergies were reviewed. The patient's tolerance of                            previous anesthesia was also reviewed. The risks                            and benefits of the procedure and the sedation                            options and risks were discussed with the patient.                            All questions were answered, and informed consent                            was obtained. Prior Anticoagulants: The patient has                            taken no previous anticoagulant or antiplatelet                            agents. ASA Grade Assessment: II - A patient with                            mild systemic disease. After reviewing the risks                            and benefits, the patient was deemed in  satisfactory condition to undergo the procedure.                           After obtaining informed consent, the colonoscope                            was passed under direct vision. Throughout the                            procedure, the patient's blood pressure, pulse, and                            oxygen saturations were monitored continuously. The                            CF [id number] #[id number] was  introduced through the anus                            and advanced to the 4 cm into the ileum. A second                            forward view of the right colon was performed. The                            colonoscopy was performed without difficulty. The                            patient tolerated the procedure well. The quality                            of the bowel preparation was good. The terminal                            ileum, ileocecal valve, appendiceal orifice, and                            rectum were photographed. Scope In: 9:49:51 AM Scope Out: 10:11:57 AM Scope Withdrawal Time: 0 hours 18 minutes 1 second  Total Procedure Duration: 0 hours 22 minutes 6 seconds  Findings:                 The perianal and digital rectal examinations were                            normal.                           Multiple small-mouthed diverticula were found in                            the sigmoid colon and ascending colon.                           A diffuse area of mildly erythematous mucosa was  found in the rectum and in the sigmoid colon                            involoving the most distal 30 cm of the colon. The                            most profound erythema was in the area of                            diverticulosis. Although erythema was present in                            the rectum, it was not as diffuse. Biopsies were                            taken with a cold forceps for histology. Estimated                            blood loss was minimal.                           A 20 mm polyp was found in the cecum. The polyp was                            carpet-like. The polyp was removed with a saline                            injection-lift technique using a cold snare.                            Resection and retrieval were complete. Estimated                            blood loss: none.                           The exam was otherwise  without abnormality on                            direct and retroflexion views. Complications:            No immediate complications. Estimated blood loss:                            None. Estimated Blood Loss:     Estimated blood loss: none. Estimated blood loss:                            none. Impression:               - Diverticulosis in the sigmoid colon and in the                            ascending colon.                           -  Erythematous mucosa in the rectum and in the                            sigmoid colon. Biopsied.                           - One 20 mm polyp in the cecum, removed using                            injection-lift and a cold snare. Resected and                            retrieved.                           - The examination was otherwise normal on direct                            and retroflexion views. Recommendation:           - Patient has a contact number available for                            emergencies. The signs and symptoms of potential                            delayed complications were discussed with the                            patient. Return to normal activities tomorrow.                            Written discharge instructions were provided to the                            patient.                           - High fiber diet.                           - Continue present medications.                           - Await pathology results.                           - Repeat colonoscopy in 6 months for surveillance                            given the piecemeal resection of the large cecal                            polyp.                           - Emerging evidence supports eating a  diet of                            fruits, vegetables, grains, calcium, and yogurt                            while reducing red meat and alcohol may reduce the                            risk of colon cancer.                           - Thank you for  allowing me to be involved in your                            colon cancer prevention. Thornton Park MD, MD 08/27/2021 10:20:16 AM This report has been signed electronically.

## 2021-08-27 NOTE — Progress Notes (Signed)
Referring Provider: Thornton Park, MD Primary Care Physician:  Burnard Bunting, MD  Reason for Procedure:  Colon cancer screening   IMPRESSION:  Need for colon cancer screening History of hyperplastic polyps in the sigmoid colon Diverticulosis Appropriate candidate for monitored anesthesia care  PLAN: Colonoscopy in the [facility] today   HPI: Chase [name] is a 75 y.o. male presents for screening colonoscopy.  Sigmoid hyperplastic polyps on colonoscopy in 2001, 2006, and most recently in 2011. Diverticulosis seen on prior studies. Surveillance in 10 years recommended.   No baseline GI symptoms.   No known family history of colon cancer or polyps. No family history of uterine/endometrial cancer, pancreatic cancer or gastric/stomach cancer.   Past Medical History:  Diagnosis Date   Allergy    Arthritis    BPH (benign prostatic hyperplasia)    Dizziness    Mild hyperlipidemia    Mononucleosis 11/09/1957   Rosacea    Wolf-Parkinson-White syndrome    30 years ago    Past Surgical History:  Procedure Laterality Date   back pain  2013   epidurals x2 for pain relief   COLONOSCOPY  2011   2006, no polyps   COLONOSCOPY     LAMINECTOMY  11/09/1974   LUMBAR LAMINECTOMY/DECOMPRESSION MICRODISCECTOMY Left 05/22/2021   Procedure: Left Lumbar Three-Four Lumbar Four-Five Microdiscectomy;  Surgeon: Newman Pies, MD;  Location: [facility];  Service: Neurosurgery;  Laterality: Left;   microdiscectemy  02/1996   NASAL SEPTOPLASTY W/ TURBINOPLASTY  2007   uvelectemy   NASAL SEPTUM SURGERY  11/09/1981   SHOULDER SURGERY  11/09/1988   VASECTOMY  11/09/1980    Current Outpatient Medications  Medication Sig Dispense Refill   cetirizine (ZYRTEC) 10 MG tablet Take 10 mg by mouth daily. Or Claritin     donepezil (ARICEPT) 10 MG tablet Take 10 mg by mouth at bedtime.     finasteride (PROSCAR) 5 MG tablet Take 5 mg by mouth daily.     fluticasone (FLONASE) 50 MCG/ACT nasal spray Place  1 spray into both nostrils daily.     Multiple Vitamin (MULTI-VITAMIN PO) Take 1 tablet by mouth daily.     naproxen sodium (ALEVE) 220 MG tablet Take 220 mg by mouth 3 (three) times daily.     Omega-3 Fatty Acids (FISH OIL PO) Take 1,000 mg by mouth daily.     zolpidem (AMBIEN) 5 MG tablet Take 1 tablet (5 mg total) by mouth at bedtime as needed for sleep. 30 tablet 0   Current Facility-Administered Medications  Medication Dose Route Frequency Provider Last Rate Last Admin   0.9 %  sodium chloride infusion  500 mL Intravenous Once Thornton Park, MD        Allergies as of 08/27/2021 - Review Complete 08/27/2021  Allergen Reaction Noted   Penicillins Other (See Comments) 09/30/2010    Family History  Problem Relation Age of Onset   Cancer Mother    Heart disease Mother    Cancer - Cervical Mother    Cancer Father    Prostate cancer Father    Colon cancer Neg Hx    Colon polyps Neg Hx    Esophageal cancer Neg Hx    Rectal cancer Neg Hx    Stomach cancer Neg Hx      Physical Exam: General:   Alert,  well-nourished, pleasant and cooperative in NAD Head:  Normocephalic and atraumatic. Eyes:  Sclera clear, no icterus.   Conjunctiva pink. Mouth:  No deformity or lesions.   Neck:  Supple; no masses or thyromegaly. Lungs:  Clear throughout to auscultation.   No wheezes. Heart:  Regular rate and rhythm; no murmurs. Abdomen:  Soft, non-tender, nondistended, normal bowel sounds, no rebound or guarding.  Msk:  Symmetrical. No boney deformities LAD: No inguinal or umbilical LAD Extremities:  No clubbing or edema. Neurologic:  Alert and  oriented x4;  grossly nonfocal Skin:  No obvious rash or bruise. Psych:  Alert and cooperative. Normal mood and affect.      Natelie Ostrosky L. Tarri Glenn, MD, MPH 08/27/2021, 9:37 AM

## 2021-08-27 NOTE — Progress Notes (Signed)
Pt's states no medical or surgical changes since previsit or office visit. 

## 2021-08-28 ENCOUNTER — Ambulatory Visit: Payer: Medicare Other

## 2021-08-28 DIAGNOSIS — M6281 Muscle weakness (generalized): Secondary | ICD-10-CM

## 2021-08-28 DIAGNOSIS — G8929 Other chronic pain: Secondary | ICD-10-CM | POA: Diagnosis not present

## 2021-08-28 DIAGNOSIS — R262 Difficulty in walking, not elsewhere classified: Secondary | ICD-10-CM

## 2021-08-28 DIAGNOSIS — M5442 Lumbago with sciatica, left side: Secondary | ICD-10-CM | POA: Diagnosis not present

## 2021-08-28 NOTE — Therapy (Signed)
[place], [place], [address] Phone: [phone]   Fax:  [phone]  Physical Therapy Treatment  Patient Details  Name: Chase [name] MRN: [id number] Date of Birth: Dec 08, 1945 Referring Provider (PT): Frederich Cha, MD   Encounter Date: 08/28/2021   PT End of Session - 08/28/21 1531     Visit Number 2    Number of Visits 7    Date for PT Re-Evaluation 10/11/21    Authorization Type MEDICARE PART A AND B;BCBS SUPPLEMENT    PT Start Time 0805    PT Stop Time 8416    PT Time Calculation (min) 39 min    Activity Tolerance Patient tolerated treatment well    Behavior During Therapy [facility] for tasks assessed/performed             Past Medical History:  Diagnosis Date   Allergy    Arthritis    BPH (benign prostatic hyperplasia)    Dizziness    Mild hyperlipidemia    Mononucleosis 11/09/1957   Rosacea    Wolf-Parkinson-White syndrome    30 years ago    Past Surgical History:  Procedure Laterality Date   back pain  2013   epidurals x2 for pain relief   COLONOSCOPY  2011   2006, no polyps   COLONOSCOPY     LAMINECTOMY  11/09/1974   LUMBAR LAMINECTOMY/DECOMPRESSION MICRODISCECTOMY Left 05/22/2021   Procedure: Left Lumbar Three-Four Lumbar Four-Five Microdiscectomy;  Surgeon: Newman Pies, MD;  Location: [facility];  Service: Neurosurgery;  Laterality: Left;   microdiscectemy  02/1996   NASAL SEPTOPLASTY W/ TURBINOPLASTY  2007   uvelectemy   NASAL SEPTUM SURGERY  11/09/1981   SHOULDER SURGERY  11/09/1988   VASECTOMY  11/09/1980    There were no vitals filed for this visit.   Subjective Assessment - 08/28/21 1526     Subjective Pt reports his work has been extremely busy since the initial eval and he has not been able to complete his HEP consistently.    Pertinent History LUMBAR LAMINECTOMY/DECOMPRESSION MICRODISCECTOMY- 05/22/21    Patient Stated Goals To strengthening the L LE so I feel  more confident with walking and asc/dsc steps    Currently in Pain? No/denies    Pain Score 0-No pain    Pain Orientation Left;Lateral    Pain Descriptors / Indicators Aching    Pain Type Chronic pain    Aggravating Factors  Crossing L LE over the R    Pain Relieving Factors Advil 200 mg in the AM and maybe later that day as needed                         [facility] Adult PT Treatment/Exercise:  Therapeutic Exercise: - Eliptical 2.5 mins forward and backwards, L1, R3 - Active hamstring stretch 1x, 20 sec, R and L - piriformis stretch 1x, 20 sec, R and L - Figure 4 stretch 1x, 20 sec, R and L - Bridging 15x, 3 sec - HL clamshells 15x, 3 sec, blue theraband - PPT 15x, 3 sec - Sidelying hip abd 15x, 3 sec -Sit to/from standing 2x10, with use of hands  Manual Therapy: NA  Neuromuscular re-ed: NA  Therapeutic Activity: NA                  PT Education - 08/28/21 1530     Education Details Updated HEP for hip abd strengthening    Person(s) Educated Patient  Methods Explanation;Demonstration;Tactile cues;Verbal cues    Comprehension Verbalized understanding;Returned demonstration;Verbal cues required;Tactile cues required              PT Short Term Goals - 08/14/21 0938       PT SHORT TERM GOAL #1   Title STGs=LTGs               PT Long Term Goals - 08/14/21 0938       PT LONG TERM GOAL #1   Title Increase L LE ankle and knee strength to 5/5 and L hip strength to 4+/5 for improved functional mobility and safet with asc/dsc steps    Baseline see flowsheets    Status New    Target Date 10/11/21      PT LONG TERM GOAL #2   Title Increase bilat hamstring flexibility to 60d for reduced low back strain and improved trunk mobility    Baseline Bilat 50d    Status New    Target Date 10/11/21      PT LONG TERM GOAL #3   Title Pt will be able to asc/dsc a flight of steps with an alternating pattern and without the use of a handrail  as demonstration of improved L LE strength and improved functional mobility    Status New    Target Date 10/11/21      PT LONG TERM GOAL #4   Title Pt will be ind in a final HEP to maintain achieved LOF    Status New    Target Date 10/11/21                   Plan - 08/28/21 1532     Clinical Impression Statement Pt reports to PT without completing HEP consistently. Ther ex for lumbopelvic/LE flexibility and strengthening were completed with these exs reviewed for HEP. Pt returned demonstration. Hip abd in sidelying was added to HEP.Pt demonstrates decreased hip ROM of the L hip vs the R. Pt tolerated today's PT session s adverse effects. Pt will benefit from skilled PT for strengthening to optimize his balance and functional mobility.    Personal Factors and Comorbidities Comorbidity 1    Comorbidities LUMBAR LAMINECTOMY/DECOMPRESSION MICRODISCECTOMY- 05/22/21; 1997 microdisctomy; 1976 laminectomy    Examination-Activity Limitations Locomotion Level;Stairs    Stability/Clinical Decision Making Stable/Uncomplicated    Clinical Decision Making Low    Rehab Potential Good    PT Frequency 1x / week    PT Duration 6 weeks    PT Treatment/Interventions ADLs/Self Care Home Management;Aquatic Therapy;Electrical Stimulation;Moist Heat;Therapeutic exercise;Therapeutic activities;Functional mobility training;Balance training;Stair training;Gait training;Patient/family education;Manual techniques;Dry needling;Passive range of motion;Taping    PT Next Visit Plan Assess response to HEP, progress ther ex as indicated, assess 5xSTS and gait pattern and set goals as indicated    PT Home Exercise Plan [id number]    Consulted and Agree with Plan of Care Patient             Patient will benefit from skilled therapeutic intervention in order to improve the following deficits and impairments:  Difficulty walking, Decreased activity tolerance, Pain, Decreased strength  Visit Diagnosis: Chronic  bilateral low back pain with left-sided sciatica  Muscle weakness (generalized)  Difficulty in walking, not elsewhere classified     Problem List Patient Active Problem List   Diagnosis Date Noted   Lumbar herniated disc 05/22/2021   Hyperlipidemia LDL goal <130 11/20/2014   Rhinitis, allergic 11/20/2014   Spinal stenosis, lumbar region, without neurogenic claudication 08/30/2013   Other  and unspecified hyperlipidemia 08/30/2013   Routine general medical examination at a health care facility 08/29/2013   Wolff-Parkinson-White syndrome    Concealed WPW (Wolff-Parkinson-White) syndrome 09/18/2011    Gar Ponto, PT 08/28/2021, 3:42 PM  [facility] [address] [place], [place], [address] Phone: [phone]   Fax:  [phone]  Name: Linville Decarolis MRN: [id number] Date of Birth: 12-26-1945

## 2021-08-29 ENCOUNTER — Telehealth: Payer: Self-pay

## 2021-08-29 NOTE — Telephone Encounter (Signed)
  Follow up Call-  Call back number 08/27/2021  Post procedure Call Back phone  # [phone]  Permission to leave phone message Yes  Some recent data might be hidden     Patient questions:  Do you have a fever, pain , or abdominal swelling? No. Pain Score  0 *  Have you tolerated food without any problems? Yes.    Have you been able to return to your normal activities? Yes.    Do you have any questions about your discharge instructions: Diet   No. Medications  No. Follow up visit  No.  Do you have questions or concerns about your Care? No.  Actions: * If pain score is 4 or above: No action needed, pain <4.  Have you developed a fever since your procedure? no  2.   Have you had an respiratory symptoms (SOB or cough) since your procedure? no  3.   Have you tested positive for COVID 19 since your procedure no  4.   Have you had any family members/close contacts diagnosed with the COVID 19 since your procedure?  no   If yes to any of these questions please route to Joylene John, RN and Joella Prince, RN

## 2021-08-31 ENCOUNTER — Encounter: Payer: Self-pay | Admitting: Gastroenterology

## 2021-09-04 ENCOUNTER — Ambulatory Visit: Payer: Medicare Other

## 2021-09-04 ENCOUNTER — Other Ambulatory Visit: Payer: Self-pay

## 2021-09-04 DIAGNOSIS — M5442 Lumbago with sciatica, left side: Secondary | ICD-10-CM | POA: Diagnosis not present

## 2021-09-04 DIAGNOSIS — R262 Difficulty in walking, not elsewhere classified: Secondary | ICD-10-CM | POA: Diagnosis not present

## 2021-09-04 DIAGNOSIS — G8929 Other chronic pain: Secondary | ICD-10-CM

## 2021-09-04 DIAGNOSIS — M6281 Muscle weakness (generalized): Secondary | ICD-10-CM | POA: Diagnosis not present

## 2021-09-04 NOTE — Therapy (Addendum)
[place], [place], [address] Phone: [phone]   Fax:  [phone]  Physical Therapy Treatment  Patient Details  Name: Chase [name] MRN: [id number] Date of Birth: 1946/03/28 Referring Provider (PT): Frederich Cha, MD   Encounter Date: 09/04/2021   PT End of Session - 09/04/21 0718     Visit Number 3    Number of Visits 7    Date for PT Re-Evaluation 10/11/21    Authorization Type MEDICARE PART A AND B;BCBS SUPPLEMENT    PT Start Time 0817    PT Stop Time 0844    PT Time Calculation (min) 27 min    Activity Tolerance Patient tolerated treatment well    Behavior During Therapy [facility] for tasks assessed/performed             Past Medical History:  Diagnosis Date   Allergy    Arthritis    BPH (benign prostatic hyperplasia)    Dizziness    Mild hyperlipidemia    Mononucleosis 11/09/1957   Rosacea    Wolf-Parkinson-White syndrome    30 years ago    Past Surgical History:  Procedure Laterality Date   back pain  2013   epidurals x2 for pain relief   COLONOSCOPY  2011   2006, no polyps   COLONOSCOPY     LAMINECTOMY  11/09/1974   LUMBAR LAMINECTOMY/DECOMPRESSION MICRODISCECTOMY Left 05/22/2021   Procedure: Left Lumbar Three-Four Lumbar Four-Five Microdiscectomy;  Surgeon: Newman Pies, MD;  Location: [place];  Service: Neurosurgery;  Laterality: Left;   microdiscectemy  02/1996   NASAL SEPTOPLASTY W/ TURBINOPLASTY  2007   uvelectemy   NASAL SEPTUM SURGERY  11/09/1981   SHOULDER SURGERY  11/09/1988   VASECTOMY  11/09/1980    Symptoms/Limitations   Subjective:Pt reports he has been consistent with completion of his HEP. Pt notes with PPT ex he experiences shooting L lateral thigh pain similar to what he experienced prior to his back surgery and he has stopped completing. He reports no issues with his other exs.  Pertinent History: Lumbar  laminectomy/decompression/Microdiscectomy-05/22/21  Patient stated goals: To strengthening the L LE so I can feel more confident with walking and asc/dsc steps  Currently in pain: No/denies  Pain score: 0-No pain  Pain location: Hip  Pain orientation: Left, lateral  Pain Descriptors/Indicators: Aching  Pain type: Chronic pain  Aggravating Factors: Crossing L leg over the R  Pain Relieving Factors: Advil 200 mg in the AM and later in the day as needed    There were no vitals filed for this visit.   [facility] Adult PT Treatment/Exercise:   Therapeutic Exercise: - Eliptical 2.5 mins forward and backwards, L1, R3 - Active hamstring stretch 2x, 20 sec, R and L - piriformis stretch 1x, 20 sec, R and L - Thomas hip stretch, 1x, 20 sec, R and L - Ankle DF stretch, 1x, 20 sec, R and L - Figure 4 stretch 1x, 20 sec, R and L - Bridging 15x, 3 sec - HL clamshells 15x, 3 sec, blue theraband - Abdominal bracing, LE s, 5x, 10 sec - Dead bug, LE s, 10x - Sidelying hip abd 15x R and L, 3 sec - Sit to/from standing 2x10, without use of hands   Manual Therapy: NA   Neuromuscular re-ed: NA   Therapeutic Activity: NA  Self care: Updated HEP with gasroc stretch, thomas stretch, and abdominal strength c 90/90 bracing, 90/90 heel touches  PT Short Term Goals - 08/14/21 0938       PT SHORT TERM GOAL #1   Title STGs=LTGs               PT Long Term Goals - 08/14/21 0938       PT LONG TERM GOAL #1   Title Increase L LE ankle and knee strength to 5/5 and L hip strength to 4+/5 for improved functional mobility and safet with asc/dsc steps    Baseline see flowsheets    Status New    Target Date 10/11/21      PT LONG TERM GOAL #2   Title Increase bilat hamstring flexibility to 60d for reduced low back strain and improved trunk mobility    Baseline Bilat 50d    Status New    Target Date 10/11/21      PT LONG TERM GOAL #3    Title Pt will be able to asc/dsc a flight of steps with an alternating pattern and without the use of a handrail as demonstration of improved L LE strength and improved functional mobility    Status New    Target Date 10/11/21      PT LONG TERM GOAL #4   Title Pt will be ind in a final HEP to maintain achieved LOF    Status New    Target Date 10/11/21                   Plan - 09/04/21 0718   Pt reports he has been consistent with the completion of his HEP. He notes c the PPT after 3-4 reps he gets a shooting pain down the lateral aspect of his L thigh. Abdominal bracing and 90/90 heel taps were substituted and pt. reported no issues. Strengthening for the LEs and lumbopelvic musculature was continued with pt tolerating well. Hip flexor stretch and ankle dorsifexion stretches were added to HEP as well. Pt is to return in 2 weeks and will assess progress at that time.  Comorbidities LUMBAR LAMINECTOMY/DECOMPRESSION MICRODISCECTOMY- 05/22/21; 1997 microdisctomy; 1976 laminectomy    Examination-Activity Limitations Locomotion Level;Stairs    Stability/Clinical Decision Making Stable/Uncomplicated    Rehab Potential Good    PT Duration 6 weeks    PT Treatment/Interventions ADLs/Self Care Home Management;Aquatic Therapy;Electrical Stimulation;Moist Heat;Therapeutic exercise;Therapeutic activities;Functional mobility training;Balance training;Stair training;Gait training;Patient/family education;Manual techniques;Dry needling;Passive range of motion;Taping    PT Next Visit Plan Assess response to HEP, progress ther ex as indicated, assess 5xSTS and gait pattern and set goals as indicated    PT Home Exercise Plan [id number]    Consulted and Agree with Plan of Care Patient             Patient will benefit from skilled therapeutic intervention in order to improve the following deficits and impairments:  Difficulty walking, Decreased activity tolerance, Pain, Decreased strength  Visit  Diagnosis: No diagnosis found.     Problem List Patient Active Problem List   Diagnosis Date Noted   Lumbar herniated disc 05/22/2021   Hyperlipidemia LDL goal <130 11/20/2014   Rhinitis, allergic 11/20/2014   Spinal stenosis, lumbar region, without neurogenic claudication 08/30/2013   Other and unspecified hyperlipidemia 08/30/2013   Routine general medical examination at a health care facility 08/29/2013   Wolff-Parkinson-White syndrome    Concealed WPW (Wolff-Parkinson-White) syndrome 09/18/2011    Gar Ponto MS, PT 09/09/21 4:55 PM    [place], [place], [address] Phone: [phone]  Fax:  [phone]  Name: Laurie Lovejoy MRN: [id number] Date of Birth: [birth year]

## 2021-09-05 DIAGNOSIS — Z6827 Body mass index (BMI) 27.0-27.9, adult: Secondary | ICD-10-CM | POA: Diagnosis not present

## 2021-09-05 DIAGNOSIS — M5116 Intervertebral disc disorders with radiculopathy, lumbar region: Secondary | ICD-10-CM | POA: Diagnosis not present

## 2021-09-05 DIAGNOSIS — R030 Elevated blood-pressure reading, without diagnosis of hypertension: Secondary | ICD-10-CM | POA: Diagnosis not present

## 2021-09-17 IMAGING — CR DG LUMBAR SPINE 1V
1 series · 1 of 1 positions shown · non-contrast
Comparison: 05/09/2021

CLINICAL DATA: Intraoperative localization

EXAM:
LUMBAR SPINE - 1 VIEW

[lateral]
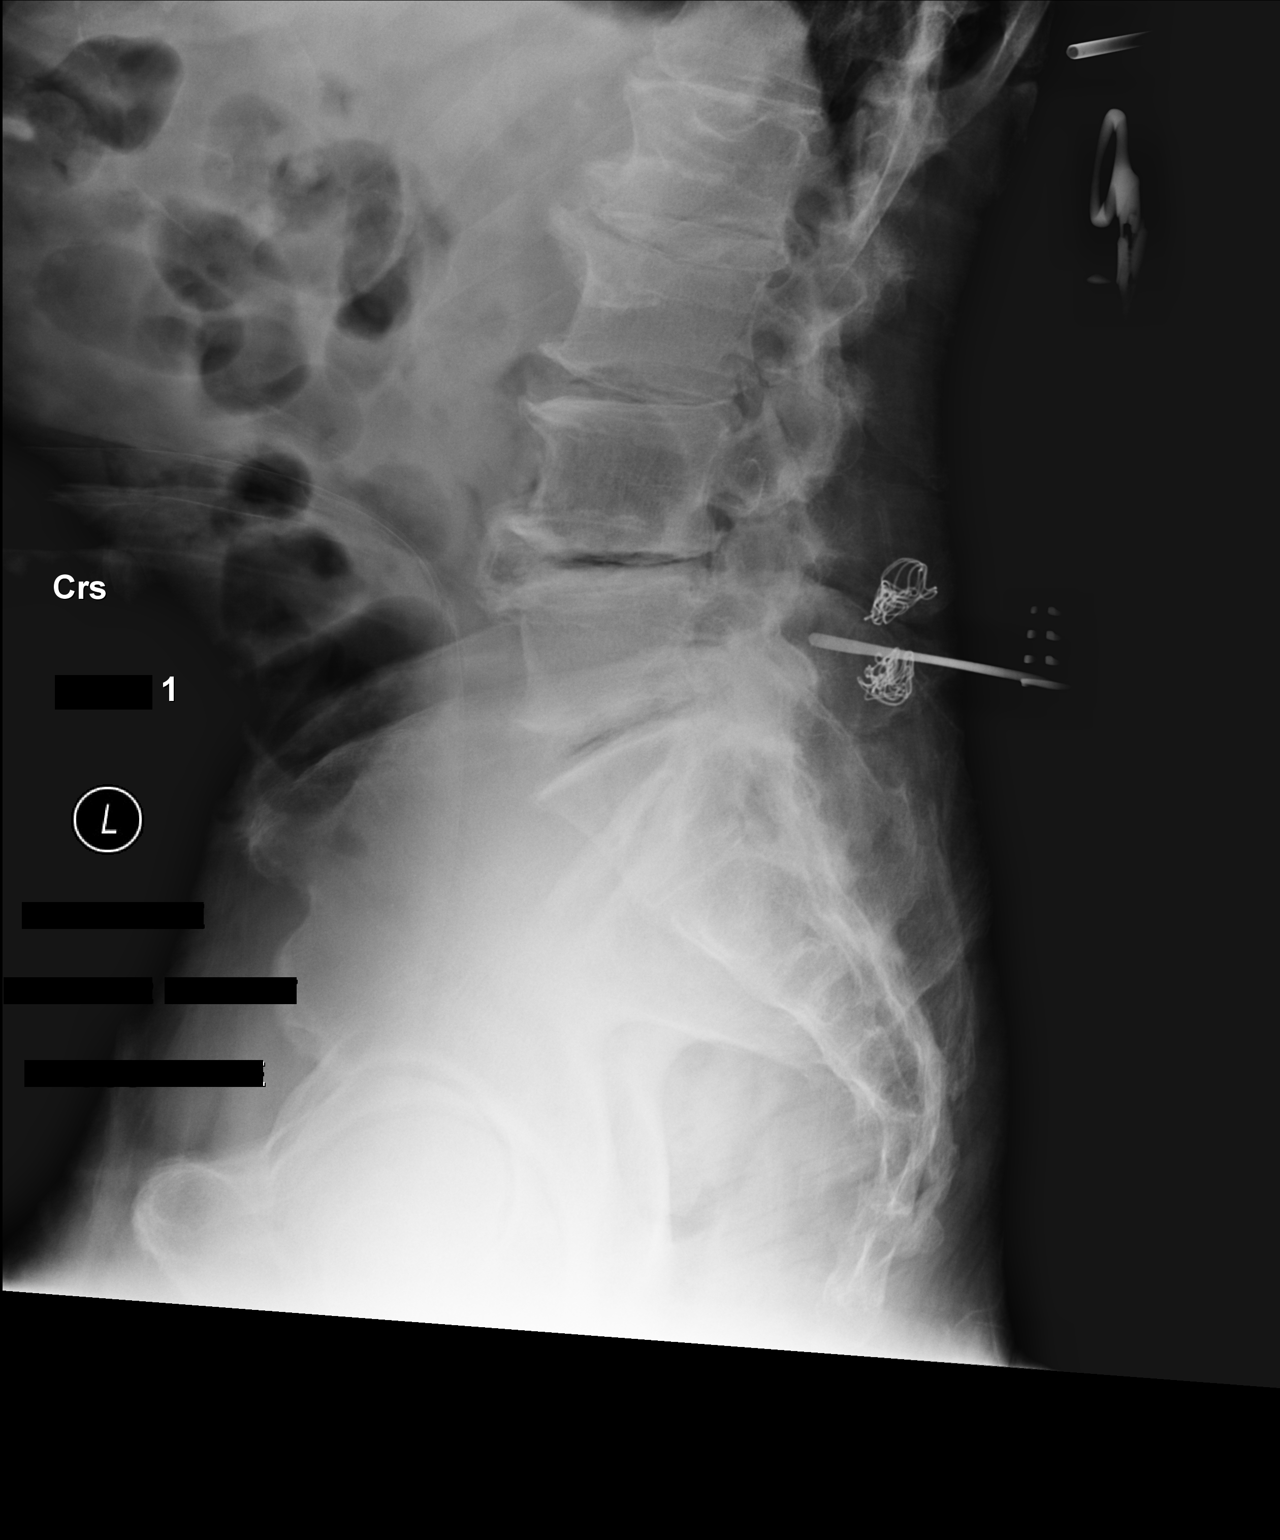

[1 of 1 positions shown; findings below may reference images not displayed]

FINDINGS: Lateral radiograph of the lumbar spine was obtained and reveals a
surgical instrument posterior to the L5 vertebral body. Multilevel
disc space narrowing is noted within the lumbar spine similar to
that seen on the prior exam.
IMPRESSION: Intraoperative localization at L5.

## 2021-09-18 ENCOUNTER — Ambulatory Visit: Payer: Medicare Other | Attending: Neurosurgery

## 2021-09-18 ENCOUNTER — Other Ambulatory Visit: Payer: Self-pay

## 2021-09-18 DIAGNOSIS — M5442 Lumbago with sciatica, left side: Secondary | ICD-10-CM | POA: Diagnosis not present

## 2021-09-18 DIAGNOSIS — G8929 Other chronic pain: Secondary | ICD-10-CM

## 2021-09-18 DIAGNOSIS — M6281 Muscle weakness (generalized): Secondary | ICD-10-CM

## 2021-09-18 DIAGNOSIS — R262 Difficulty in walking, not elsewhere classified: Secondary | ICD-10-CM

## 2021-09-18 NOTE — Therapy (Signed)
[place], [place], [address] Phone: [phone]   Fax:  [phone]  Physical Therapy Treatment  Patient Details  Name: Chase [name] MRN: [id number] Date of Birth: 1946-05-31 Referring Provider (PT): Frederich Cha, MD   Encounter Date: 09/18/2021   PT End of Session - 09/18/21 0922     Visit Number 4    Number of Visits 7    Date for PT Re-Evaluation 10/11/21    Authorization Type MEDICARE PART A AND B;BCBS SUPPLEMENT    PT Start Time 0715    PT Stop Time 0800    PT Time Calculation (min) 45 min    Activity Tolerance Patient tolerated treatment well    Behavior During Therapy [facility] for tasks assessed/performed             Past Medical History:  Diagnosis Date   Allergy    Arthritis    BPH (benign prostatic hyperplasia)    Dizziness    Mild hyperlipidemia    Mononucleosis 11/09/1957   Rosacea    Wolf-Parkinson-White syndrome    30 years ago    Past Surgical History:  Procedure Laterality Date   back pain  2013   epidurals x2 for pain relief   COLONOSCOPY  2011   2006, no polyps   COLONOSCOPY     LAMINECTOMY  11/09/1974   LUMBAR LAMINECTOMY/DECOMPRESSION MICRODISCECTOMY Left 05/22/2021   Procedure: Left Lumbar Three-Four Lumbar Four-Five Microdiscectomy;  Surgeon: Newman Pies, MD;  Location: [place];  Service: Neurosurgery;  Laterality: Left;   microdiscectemy  02/1996   NASAL SEPTOPLASTY W/ TURBINOPLASTY  2007   uvelectemy   NASAL SEPTUM SURGERY  11/09/1981   SHOULDER SURGERY  11/09/1988   VASECTOMY  11/09/1980    There were no vitals filed for this visit.   Subjective Assessment - 09/18/21 0721     Subjective Pt reports no pain, but that his L leg does not feel as strong. Still using HR to asc/dsc steps.    Pertinent History LUMBAR LAMINECTOMY/DECOMPRESSION MICRODISCECTOMY- 05/22/21    Patient Stated Goals To strengthening the L LE so I feel more confident with walking and  asc/dsc steps    Currently in Pain? No/denies              [facility] Adult PT Treatment/Exercise:   Therapeutic Exercise: - Eliptical 2.5 mins forward and backwards, L1, R3 - Active hamstring stretch 2x, 20 sec, R and L - piriformis stretch 2x, 20 sec, R and L - Thomas hip stretch, 2x, 20 sec, R and L - ITB stretch, 2x20sec, R and L - Ankle DF stretch, 2x, 20 sec, R and L - Figure 4 stretch 1x, 20 sec, R and L - Forward step ups, x15, 8" box - Lateral step ups, x15, 8" box - quad sets, x10 - Long sit c quad set c hamstring stretch - SLS sit to standing, 22", L unable to complete, R 15x Manual Therapy: NA   Neuromuscular re-ed: SLS: L 8,10,21=13" R 6,22,8= 12"    Therapeutic Activity: NA   Self care: Updated HEP with gasroc stretch, thomas stretch, and abdominal strength c 90/90 bracing, 90/90 heel touches  Not completed today: - Bridging 15x, 3 sec - HL clamshells 15x, 3 sec, blue theraband - Abdominal bracing, LE s, 5x, 10 sec - Dead bug, LE s, 10x - Sidelying hip abd 15x R and L, 3 sec - Sit to/from standing 2x10, without use of hands  PT Education - 09/18/21 0919     Education Details Progressed HEP with emphasis on quad strengthening and hamstring stretching. Recommended cuff weights for HEP.    Person(s) Educated Patient    Methods Explanation;Demonstration;Tactile cues;Verbal cues;Handout    Comprehension Verbalized understanding;Returned demonstration;Verbal cues required;Tactile cues required              PT Short Term Goals - 08/14/21 0938       PT SHORT TERM GOAL #1   Title STGs=LTGs               PT Long Term Goals - 08/14/21 0938       PT LONG TERM GOAL #1   Title Increase L LE ankle and knee strength to 5/5 and L hip strength to 4+/5 for improved functional mobility and safet with asc/dsc steps    Baseline see flowsheets    Status New    Target Date 10/11/21      PT LONG TERM GOAL #2    Title Increase bilat hamstring flexibility to 60d for reduced low back strain and improved trunk mobility    Baseline Bilat 50d    Status New    Target Date 10/11/21      PT LONG TERM GOAL #3   Title Pt will be able to asc/dsc a flight of steps with an alternating pattern and without the use of a handrail as demonstration of improved L LE strength and improved functional mobility    Status New    Target Date 10/11/21      PT LONG TERM GOAL #4   Title Pt will be ind in a final HEP to maintain achieved LOF    Status New    Target Date 10/11/21                   Plan - 09/18/21 2355     Clinical Impression Statement Pt continues to reports L leg weakness and still using a HR to asc/dsc steps. Further assessment revealed marked  weakness with L SL sit to stand and decreased knee ext  c LAQ at L=-25d, R = -5d, and knee ext supine c quad set=-10d. Pt was instructed in ther ex in both the [facility] and [facility] for quad strengthening and knee ext ROM/hamstring stretching. Pt was provided an update on his HEP. PT will continue to address deficits determined today.    Personal Factors and Comorbidities Comorbidity 1    Comorbidities LUMBAR LAMINECTOMY/DECOMPRESSION MICRODISCECTOMY- 05/22/21; 1997 microdisctomy; 1976 laminectomy    Examination-Activity Limitations Locomotion Level;Stairs    Stability/Clinical Decision Making Stable/Uncomplicated    Clinical Decision Making Low    Rehab Potential Good    PT Frequency 1x / week    PT Duration 6 weeks    PT Treatment/Interventions ADLs/Self Care Home Management;Aquatic Therapy;Electrical Stimulation;Moist Heat;Therapeutic exercise;Therapeutic activities;Functional mobility training;Balance training;Stair training;Gait training;Patient/family education;Manual techniques;Dry needling;Passive range of motion;Taping    PT Next Visit Plan Assess response to updated HEP, progress ther ex as indicated.    PT Home Exercise Plan [id number]    Consulted and  Agree with Plan of Care Patient             Patient will benefit from skilled therapeutic intervention in order to improve the following deficits and impairments:  Difficulty walking, Decreased activity tolerance, Pain, Decreased strength  Visit Diagnosis: Chronic bilateral low back pain with left-sided sciatica  Muscle weakness (generalized)  Difficulty in walking, not elsewhere classified     Problem List  Patient Active Problem List   Diagnosis Date Noted   Lumbar herniated disc 05/22/2021   Hyperlipidemia LDL goal <130 11/20/2014   Rhinitis, allergic 11/20/2014   Spinal stenosis, lumbar region, without neurogenic claudication 08/30/2013   Other and unspecified hyperlipidemia 08/30/2013   Routine general medical examination at a health care facility 08/29/2013   Wolff-Parkinson-White syndrome    Concealed WPW (Wolff-Parkinson-White) syndrome 09/18/2011    Gar Ponto MS, PT 09/18/21 9:40 AM   [place], [place], [address] Phone: [phone]   Fax:  [phone]  Name: Ammon Muscatello MRN: [id number] Date of Birth: 11/22/1945

## 2021-09-25 ENCOUNTER — Other Ambulatory Visit: Payer: Self-pay

## 2021-09-25 ENCOUNTER — Ambulatory Visit: Payer: Medicare Other

## 2021-09-25 DIAGNOSIS — M5442 Lumbago with sciatica, left side: Secondary | ICD-10-CM

## 2021-09-25 DIAGNOSIS — M6281 Muscle weakness (generalized): Secondary | ICD-10-CM

## 2021-09-25 DIAGNOSIS — G8929 Other chronic pain: Secondary | ICD-10-CM

## 2021-09-25 DIAGNOSIS — R262 Difficulty in walking, not elsewhere classified: Secondary | ICD-10-CM

## 2021-09-26 NOTE — Therapy (Signed)
[place], [place], [address] Phone: [phone]   Fax:  [phone]  Physical Therapy Treatment  Patient Details  Name: Chase [name] MRN: [id number] Date of Birth: 12/28/1945 Referring Provider (PT): Frederich Cha, MD   Encounter Date: 09/25/2021   PT End of Session - 09/26/21 2048     Visit Number 5    Number of Visits 7    Date for PT Re-Evaluation 10/11/21    Authorization Type MEDICARE PART A AND B;BCBS SUPPLEMENT    PT Start Time 0718    PT Stop Time 0802    PT Time Calculation (min) 44 min    Activity Tolerance Patient tolerated treatment well    Behavior During Therapy [facility] for tasks assessed/performed             Past Medical History:  Diagnosis Date   Allergy    Arthritis    BPH (benign prostatic hyperplasia)    Dizziness    Mild hyperlipidemia    Mononucleosis 11/09/1957   Rosacea    Wolf-Parkinson-White syndrome    30 years ago    Past Surgical History:  Procedure Laterality Date   back pain  2013   epidurals x2 for pain relief   COLONOSCOPY  2011   2006, no polyps   COLONOSCOPY     LAMINECTOMY  11/09/1974   LUMBAR LAMINECTOMY/DECOMPRESSION MICRODISCECTOMY Left 05/22/2021   Procedure: Left Lumbar Three-Four Lumbar Four-Five Microdiscectomy;  Surgeon: Newman Pies, MD;  Location: [place];  Service: Neurosurgery;  Laterality: Left;   microdiscectemy  02/1996   NASAL SEPTOPLASTY W/ TURBINOPLASTY  2007   uvelectemy   NASAL SEPTUM SURGERY  11/09/1981   SHOULDER SURGERY  11/09/1988   VASECTOMY  11/09/1980    There were no vitals filed for this visit.   Subjective Assessment - 09/26/21 2044     Subjective Pt expressed concern about hsi L leg being able to get stronger.    Pertinent History LUMBAR LAMINECTOMY/DECOMPRESSION MICRODISCECTOMY- 05/22/21    Patient Stated Goals To strengthening the L LE so I feel more confident with walking and asc/dsc steps    Currently in  Pain? Yes    Pain Score 0-No pain                                        PT Education - 09/26/21 2047     Education Details HEP update. Disdussed the strengthening process with pt.    Person(s) Educated Patient    Methods Explanation    Comprehension Verbalized understanding             [facility] Adult PT Treatment/Exercise:   Therapeutic Exercise: - Nu-step 5 mins, L7, LE only - TKE 3x10, BTB - Knee ext, omega, 3x10, BLE 20#. Then 3x10, BLE con, RLE ecc, 15# - Knee flex, omega, BLE 25# - leg press, cybex, BLE 80lbs - hip abd, cybex, L and R, 3x10. 25#   Neuromuscular re-ed: NA   Therapeutic Activity: NA   Self care: Updated HEP with gasroc stretch, thomas stretch, and abdominal strength c 90/90 bracing, 90/90 heel touches   Not completed today: - Bridging 15x, 3 sec - HL clamshells 15x, 3 sec, blue theraband - Abdominal bracing, LE s, 5x, 10 sec - Dead bug, LE s, 10x - Sidelying hip abd 15x R and L, 3 sec - Sit to/from standing 2x10, without use  of hands  - Eliptical 2.5 mins forward and backwards, L1, R3 - Active hamstring stretch 2x, 20 sec, R and L - piriformis stretch 2x, 20 sec, R and L - Thomas hip stretch, 2x, 20 sec, R and L - ITB stretch, 2x20sec, R and L - Ankle DF stretch, 2x, 20 sec, R and L - Figure 4 stretch 1x, 20 sec, R and L - Forward step ups, x15, 8" box - Lateral step ups, x15, 8" box - quad sets, x10 - Long sit c quad set c hamstring stretch   PT Short Term Goals - 08/14/21 0938       PT SHORT TERM GOAL #1   Title STGs=LTGs               PT Long Term Goals - 08/14/21 0938       PT LONG TERM GOAL #1   Title Increase L LE ankle and knee strength to 5/5 and L hip strength to 4+/5 for improved functional mobility and safet with asc/dsc steps    Baseline see flowsheets    Status New    Target Date 10/11/21      PT LONG TERM GOAL #2   Title Increase bilat hamstring flexibility to 60d for reduced  low back strain and improved trunk mobility    Baseline Bilat 50d    Status New    Target Date 10/11/21      PT LONG TERM GOAL #3   Title Pt will be able to asc/dsc a flight of steps with an alternating pattern and without the use of a handrail as demonstration of improved L LE strength and improved functional mobility    Status New    Target Date 10/11/21      PT LONG TERM GOAL #4   Title Pt will be ind in a final HEP to maintain achieved LOF    Status New    Target Date 10/11/21                   Plan - 09/26/21 2049     Clinical Impression Statement PT was completed for L LE strengthening with focus on the quads, hamstrings and gluts. L LE weakness continues with pt not able to come sit to standing c the LE for 22" height. Pt's HEP has been updated with ther with greater demand. Anticipate strength gains progressing slowly. Pt tolerated PT without adverse effects, participating with good effort.    Comorbidities LUMBAR LAMINECTOMY/DECOMPRESSION MICRODISCECTOMY- 05/22/21; 1997 microdisctomy; 1976 laminectomy    Examination-Activity Limitations Locomotion Level;Stairs    Stability/Clinical Decision Making Stable/Uncomplicated    Clinical Decision Making Low    Rehab Potential Good    PT Frequency 1x / week    PT Duration 6 weeks    PT Treatment/Interventions ADLs/Self Care Home Management;Aquatic Therapy;Electrical Stimulation;Moist Heat;Therapeutic exercise;Therapeutic activities;Functional mobility training;Balance training;Stair training;Gait training;Patient/family education;Manual techniques;Dry needling;Passive range of motion;Taping    PT Next Visit Plan Assess response to updated HEP, progress ther ex as indicated, assess LTGs.    PT Home Exercise Plan [id number]    Consulted and Agree with Plan of Care Patient             Patient will benefit from skilled therapeutic intervention in order to improve the following deficits and impairments:  Difficulty walking,  Decreased activity tolerance, Pain, Decreased strength  Visit Diagnosis: Chronic bilateral low back pain with left-sided sciatica  Muscle weakness (generalized)  Difficulty in walking, not elsewhere classified  Problem List Patient Active Problem List   Diagnosis Date Noted   Lumbar herniated disc 05/22/2021   Hyperlipidemia LDL goal <130 11/20/2014   Rhinitis, allergic 11/20/2014   Spinal stenosis, lumbar region, without neurogenic claudication 08/30/2013   Other and unspecified hyperlipidemia 08/30/2013   Routine general medical examination at a health care facility 08/29/2013   Wolff-Parkinson-White syndrome    Concealed WPW (Wolff-Parkinson-White) syndrome 09/18/2011    Gar Ponto MS, PT 09/26/21 9:12 PM   [facility] [address] [place], [place], [address] Phone: [phone]   Fax:  [phone]  Name: Claudis Giovanelli MRN: [id number] Date of Birth: 1946-06-13

## 2021-10-09 ENCOUNTER — Ambulatory Visit: Payer: Medicare Other | Attending: Neurosurgery

## 2021-10-09 ENCOUNTER — Other Ambulatory Visit: Payer: Self-pay

## 2021-10-09 DIAGNOSIS — G8929 Other chronic pain: Secondary | ICD-10-CM | POA: Insufficient documentation

## 2021-10-09 DIAGNOSIS — M5442 Lumbago with sciatica, left side: Secondary | ICD-10-CM | POA: Diagnosis not present

## 2021-10-09 DIAGNOSIS — R262 Difficulty in walking, not elsewhere classified: Secondary | ICD-10-CM | POA: Diagnosis not present

## 2021-10-09 DIAGNOSIS — M6281 Muscle weakness (generalized): Secondary | ICD-10-CM | POA: Diagnosis not present

## 2021-10-09 NOTE — Therapy (Addendum)
[place], [place], [address] Phone: [phone]   Fax:  [phone]  Physical Therapy Treatment/Discharge  Patient Details  Name: Chase [name] MRN: [id number] Date of Birth: 08-21-1946 Referring Provider (PT): Frederich Cha, MD   Encounter Date: 10/09/2021   PT End of Session - 10/09/21 0757     Visit Number 6    Number of Visits 7    Date for PT Re-Evaluation 10/11/21    Authorization Type MEDICARE PART A AND B;BCBS SUPPLEMENT    PT Start Time 0717    PT Stop Time 0800    PT Time Calculation (min) 43 min    Activity Tolerance Patient tolerated treatment well    Behavior During Therapy [facility] for tasks assessed/performed             Past Medical History:  Diagnosis Date   Allergy    Arthritis    BPH (benign prostatic hyperplasia)    Dizziness    Mild hyperlipidemia    Mononucleosis 11/09/1957   Rosacea    Wolf-Parkinson-White syndrome    30 years ago    Past Surgical History:  Procedure Laterality Date   back pain  2013   epidurals x2 for pain relief   COLONOSCOPY  2011   2006, no polyps   COLONOSCOPY     LAMINECTOMY  11/09/1974   LUMBAR LAMINECTOMY/DECOMPRESSION MICRODISCECTOMY Left 05/22/2021   Procedure: Left Lumbar Three-Four Lumbar Four-Five Microdiscectomy;  Surgeon: Newman Pies, MD;  Location: [place];  Service: Neurosurgery;  Laterality: Left;   microdiscectemy  02/1996   NASAL SEPTOPLASTY W/ TURBINOPLASTY  2007   uvelectemy   NASAL SEPTUM SURGERY  11/09/1981   SHOULDER SURGERY  11/09/1988   VASECTOMY  11/09/1980    There were no vitals filed for this visit.   Subjective Assessment - 10/09/21 0808     Subjective Pt reports he has been working out at [employer].    Patient Stated Goals To strengthening the L LE so I feel more confident with walking and asc/dsc steps    Pain Score 0-No pain            [facility] Adult PT Treatment/Exercise:   Therapeutic  Exercise:  - Eliptical 2.5 mins forward and backwards, L5, R5 - RDL, L, to cone, 2x10 - SL stands, L, from 22" height, min 1 hand A. Pt was able to to complete 5 reps s hand A, but with decreased control on comparison to the R - Forward step ups and offs, 8" box, 3x10, min decreased control c step offs - Knee ext, omega, 3x10, BLE 25#, BLE con, LLE ecc, 15# - Knee flex, omega, BLE con, L ecc, 25#, 3x10 - leg press, cybex, BLE 80lbs    Neuromuscular re-ed: NA   Therapeutic Activity: NA   Self care: NA   Not completed today: - Bridging 15x, 3 sec - HL clamshells 15x, 3 sec, blue theraband - Abdominal bracing, LE s, 5x, 10 sec - Dead bug, LE s, 10x - Sidelying hip abd 15x R and L, 3 sec - Sit to/from standing 2x10, without use of hands - Active hamstring stretch 2x, 20 sec, R and L - piriformis stretch 2x, 20 sec, R and L - Thomas hip stretch, 2x, 20 sec, R and L - ITB stretch, 2x20sec, R and L - Ankle DF stretch, 2x, 20 sec, R and L - Figure 4 stretch 1x, 20 sec, R and L - Forward step ups, x15, 8"  box - Lateral step ups, x15, 8" box - quad sets, x10 - Long sit c quad set c hamstring stretch -- hip abd, cybex, L and R, 3x10. 25#                              PT Short Term Goals - 08/14/21 0938       PT SHORT TERM GOAL #1   Title STGs=LTGs               PT Long Term Goals - 08/14/21 0938       PT LONG TERM GOAL #1   Title Increase L LE ankle and knee strength to 5/5 and L hip strength to 4+/5 for improved functional mobility and safet with asc/dsc steps    Baseline see flowsheets    Status New    Target Date 10/11/21      PT LONG TERM GOAL #2   Title Increase bilat hamstring flexibility to 60d for reduced low back strain and improved trunk mobility    Baseline Bilat 50d    Status New    Target Date 10/11/21      PT LONG TERM GOAL #3   Title Pt will be able to asc/dsc a flight of steps with an alternating pattern and without the  use of a handrail as demonstration of improved L LE strength and improved functional mobility    Status New    Target Date 10/11/21      PT LONG TERM GOAL #4   Title Pt will be ind in a final HEP to maintain achieved LOF    Status New    Target Date 10/11/21                   Plan - 10/09/21 0759     Clinical Impression Statement Pt demonstrates improved L knee ext strength today with SL STS, forward step ups/offs, and with increase resistance with weight increase. L knee control is decreased in comparison to the R for SL STS and stepping off from and 8" step. Pt has started working out at a gym to address his L LE strength. Pt tolerated today's session without adverse effects.    Personal Factors and Comorbidities Comorbidity 1    Comorbidities LUMBAR LAMINECTOMY/DECOMPRESSION MICRODISCECTOMY- 05/22/21; 1997 microdisctomy; 1976 laminectomy    Examination-Activity Limitations Locomotion Level;Stairs    Stability/Clinical Decision Making Stable/Uncomplicated    Clinical Decision Making Low    Rehab Potential Good    PT Frequency 1x / week    PT Duration 6 weeks    PT Treatment/Interventions ADLs/Self Care Home Management;Aquatic Therapy;Electrical Stimulation;Moist Heat;Therapeutic exercise;Therapeutic activities;Functional mobility training;Balance training;Stair training;Gait training;Patient/family education;Manual techniques;Dry needling;Passive range of motion;Taping    PT Next Visit Plan review final HEP, anticipate Dc the nexy PT session    PT Home Exercise Plan [place] and Agree with Plan of Care Patient             Patient will benefit from skilled therapeutic intervention in order to improve the following deficits and impairments:  Difficulty walking, Decreased activity tolerance, Pain, Decreased strength  Visit Diagnosis: Chronic bilateral low back pain with left-sided sciatica  Muscle weakness (generalized)  Difficulty in walking, not elsewhere  classified     Problem List Patient Active Problem List   Diagnosis Date Noted   Lumbar herniated disc 05/22/2021   Hyperlipidemia LDL goal <130 11/20/2014   Rhinitis,  allergic 11/20/2014   Spinal stenosis, lumbar region, without neurogenic claudication 08/30/2013   Other and unspecified hyperlipidemia 08/30/2013   Routine general medical examination at a health care facility 08/29/2013   Wolff-Parkinson-White syndrome    Concealed WPW (Wolff-Parkinson-White) syndrome 09/18/2011   Gar Ponto MS, PT 10/09/21 8:10 AM  PHYSICAL THERAPY DISCHARGE SUMMARY  Visits from Start of Care: 6  Current functional level related to goals / functional outcomes: See above. Pt cancelled his last visit. Pt has made good progress and is Ind in a HEP to continue the rehab process.   Remaining deficits: See above   Education / Equipment: HEP   Patient agrees to discharge. Patient goals were partially met. Patient is being discharged due to not returning since the last visit.  Gar Ponto MS, PT 11/09/21 8:29 PM    [facility] [address] [place], [place], [address] Phone: [phone]   Fax:  [phone]  Name: Chase [name] MRN: [id number] Date of Birth: 1945/12/30

## 2021-10-16 ENCOUNTER — Ambulatory Visit: Payer: Medicare Other

## 2021-10-22 DIAGNOSIS — H5213 Myopia, bilateral: Secondary | ICD-10-CM | POA: Diagnosis not present

## 2021-10-22 DIAGNOSIS — H43811 Vitreous degeneration, right eye: Secondary | ICD-10-CM | POA: Diagnosis not present

## 2021-10-22 DIAGNOSIS — H2513 Age-related nuclear cataract, bilateral: Secondary | ICD-10-CM | POA: Diagnosis not present

## 2021-10-22 DIAGNOSIS — H524 Presbyopia: Secondary | ICD-10-CM | POA: Diagnosis not present

## 2022-02-06 ENCOUNTER — Ambulatory Visit (AMBULATORY_SURGERY_CENTER): Payer: Medicare Other

## 2022-02-06 ENCOUNTER — Other Ambulatory Visit: Payer: Self-pay

## 2022-02-06 VITALS — Ht 68.0 in | Wt 175.0 lb

## 2022-02-06 DIAGNOSIS — Z86010 Personal history of colonic polyps: Secondary | ICD-10-CM

## 2022-02-06 MED ORDER — PEG 3350-KCL-NA BICARB-NACL 420 G PO SOLR: 4000.0000 mL | Freq: Once | ORAL | 0 refills | Status: AC

## 2022-02-06 NOTE — Progress Notes (Signed)
No egg or soy allergy known to patient  ?No issues known to pt with past sedation with any surgeries or procedures ?Patient denies ever being told they had issues or difficulty with intubation  ?No FH of Malignant Hyperthermia ?Pt is not on diet pills ?Pt is not on home 02  ?Pt is not on blood thinners  ?Pt denies issues with constipation  ?No A fib or A flutter ?NO PA's for preps discussed with pt in PV today  ?Discussed with pt there will be an out-of-pocket cost for prep and that varies from $0 to 70 + dollars - pt verbalized understanding  ?Due to the COVID-19 pandemic we are asking patients to follow certain guidelines in PV and the [place]   ?Pt aware of COVID protocols and [facility] guidelines  ?PV completed over the phone. Pt verified name, DOB, address and insurance during PV today.  ?Pt mailed instruction packet with copy of consent form to read and not return, and instructions.  ?Pt encouraged to call with questions or issues.  ?If pt has My chart, procedure instructions sent via My Chart  ? ?

## 2022-03-04 ENCOUNTER — Encounter: Payer: Self-pay | Admitting: Gastroenterology

## 2022-03-04 ENCOUNTER — Ambulatory Visit (AMBULATORY_SURGERY_CENTER): Payer: Medicare Other | Admitting: Gastroenterology

## 2022-03-04 VITALS — BP 105/64 | HR 57 | Temp 96.2°F | Resp 12 | Ht 68.0 in | Wt 175.0 lb

## 2022-03-04 DIAGNOSIS — D214 Benign neoplasm of connective and other soft tissue of abdomen: Secondary | ICD-10-CM | POA: Diagnosis not present

## 2022-03-04 DIAGNOSIS — D123 Benign neoplasm of transverse colon: Secondary | ICD-10-CM

## 2022-03-04 DIAGNOSIS — Z86010 Personal history of colonic polyps: Secondary | ICD-10-CM | POA: Diagnosis not present

## 2022-03-04 MED ORDER — SODIUM CHLORIDE 0.9 % IV SOLN: 500.0000 mL | Freq: Once | INTRAVENOUS | Status: DC

## 2022-03-04 NOTE — Progress Notes (Signed)
Pt's states no medical or surgical changes since previsit or office visit. 

## 2022-03-04 NOTE — Progress Notes (Signed)
PT taken to PACU. Monitors in place. VSS. Report given to RN. 

## 2022-03-04 NOTE — Patient Instructions (Signed)
Please read handouts provided. Continue present medications. Await pathology results.  Repeat colonoscopy in 3 years for screening.   YOU HAD AN ENDOSCOPIC PROCEDURE TODAY AT THE [facility]:   Refer to the procedure report that was given to you for any specific questions about what was found during the examination.  If the procedure report does not answer your questions, please call your gastroenterologist to clarify.  If you requested that your care partner not be given the details of your procedure findings, then the procedure report has been included in a sealed envelope for you to review at your convenience later.  YOU SHOULD EXPECT: Some feelings of bloating in the abdomen. Passage of more gas than usual.  Walking can help get rid of the air that was put into your GI tract during the procedure and reduce the bloating. If you had a lower endoscopy (such as a colonoscopy or flexible sigmoidoscopy) you may notice spotting of blood in your stool or on the toilet paper. If you underwent a bowel prep for your procedure, you may not have a normal bowel movement for a few days.  Please Note:  You might notice some irritation and congestion in your nose or some drainage.  This is from the oxygen used during your procedure.  There is no need for concern and it should clear up in a day or so.  SYMPTOMS TO REPORT IMMEDIATELY:  Following lower endoscopy (colonoscopy or flexible sigmoidoscopy):  Excessive amounts of blood in the stool  Significant tenderness or worsening of abdominal pains  Swelling of the abdomen that is new, acute  Fever of 100F or higher   For urgent or emergent issues, a gastroenterologist can be reached at any hour by calling [phone]. Do not use MyChart messaging for urgent concerns.    DIET:  We do recommend a small meal at first, but then you may proceed to your regular diet.  Drink plenty of fluids but you should avoid alcoholic beverages for 24  hours.  ACTIVITY:  You should plan to take it easy for the rest of today and you should NOT DRIVE or use heavy machinery until tomorrow (because of the sedation medicines used during the test).    FOLLOW UP: Our staff will call the number listed on your records 48-72 hours following your procedure to check on you and address any questions or concerns that you may have regarding the information given to you following your procedure. If we do not reach you, we will leave a message.  We will attempt to reach you two times.  During this call, we will ask if you have developed any symptoms of COVID 19. If you develop any symptoms (ie: fever, flu-like symptoms, shortness of breath, cough etc.) before then, please call [phone].  If you test positive for Covid 19 in the 2 weeks post procedure, please call and report this information to us.    If any biopsies were taken you will be contacted by phone or by letter within the next 1-3 weeks.  Please call us at [phone] if you have not heard about the biopsies in 3 weeks.    SIGNATURES/CONFIDENTIALITY: You and/or your care partner have signed paperwork which will be entered into your electronic medical record.  These signatures attest to the fact that that the information above on your After Visit Summary has been reviewed and is understood.  Full responsibility of the confidentiality of this discharge information lies with you and/or your   care-partner.  

## 2022-03-04 NOTE — Op Note (Signed)
[place] ?Patient Name: Cleavon Goldman ?Procedure Date: 03/04/2022 10:23 AM ?MRN: [id number] ?Endoscopist: Thornton Park MD, MD ?Age: 76 ?Referring MD:  ?Date of Birth: June 30, 1946 ?Gender: Male ?Account #: [id number] ?Procedure:                Colonoscopy ?Indications:              High risk colon cancer surveillance: Personal  ?                          history of adenoma (10 mm or greater in size) ?                          107mm cecal tubular adenoma resected 08/27/21 ?                          Hyperplastic polyps were seen on colonoscopies  ?                          prior to that time ?Medicines:                Monitored Anesthesia Care ?Procedure:                Pre-Anesthesia Assessment: ?                          - Prior to the procedure, a History and Physical  ?                          was performed, and patient medications and  ?                          allergies were reviewed. The patient's tolerance of  ?                          previous anesthesia was also reviewed. The risks  ?                          and benefits of the procedure and the sedation  ?                          options and risks were discussed with the patient.  ?                          All questions were answered, and informed consent  ?                          was obtained. Prior Anticoagulants: The patient has  ?                          taken no previous anticoagulant or antiplatelet  ?                          agents. ASA Grade Assessment: II - A patient with  ?  mild systemic disease. After reviewing the risks  ?                          and benefits, the patient was deemed in  ?                          satisfactory condition to undergo the procedure. ?                          After obtaining informed consent, the colonoscope  ?                          was passed under direct vision. Throughout the  ?                          procedure, the patient's blood pressure, pulse, and  ?                           oxygen saturations were monitored continuously. The  ?                          CF [id number] #[id number] was introduced through the anus  ?                          and advanced to the the cecum, identified by  ?                          appendiceal orifice and ileocecal valve. A second  ?                          forward view of the right colon was performed. The  ?                          colonoscopy was performed without difficulty. The  ?                          patient tolerated the procedure well. The quality  ?                          of the bowel preparation was good. The terminal  ?                          ileum, ileocecal valve, appendiceal orifice, and  ?                          rectum were photographed. ?Scope In: 10:31:55 AM ?Scope Out: 10:46:38 AM ?Scope Withdrawal Time: 0 hours 12 minutes 0 seconds  ?Total Procedure Duration: 0 hours 14 minutes 43 seconds  ?Findings:                 The perianal and digital rectal examinations were  ?                          normal. ?  Multiple small and large-mouthed diverticula were  ?                          found in the sigmoid colon, descending colon and  ?                          ascending colon. ?                          A 1 mm polyp was found in the distal transverse  ?                          colon. The polyp was sessile. The polyp was removed  ?                          with a cold snare. Resection and retrieval were  ?                          complete. Estimated blood loss was minimal. ?                          A localized area of scarred mucosa was found in the  ?                          cecum. ?                          The exam was otherwise without abnormality on  ?                          direct and retroflexion views. ?Complications:            No immediate complications. ?Estimated Blood Loss:     Estimated blood loss was minimal. ?Impression:               - Diverticulosis in the sigmoid colon,  descending  ?                          colon, and in the ascending colon. ?                          - One 1 mm polyp in the distal transverse colon,  ?                          removed with a cold snare. Resected and retrieved. ?                          - Scarred mucosa in the cecum. ?                          - The examination was otherwise normal on direct  ?                          and retroflexion views. ?Recommendation:           -  Patient has a contact number available for  ?                          emergencies. The signs and symptoms of potential  ?                          delayed complications were discussed with the  ?                          patient. Return to normal activities tomorrow.  ?                          Written discharge instructions were provided to the  ?                          patient. ?                          - Resume previous diet. ?                          - Continue present medications. ?                          - Await pathology results. ?                          - Repeat colonoscopy in 3 years for surveillance. ?                          - Emerging evidence supports eating a diet of  ?                          fruits, vegetables, grains, calcium, and yogurt  ?                          while reducing red meat and alcohol may reduce the  ?                          risk of colon cancer. ?                          - Thank you for allowing me to be involved in your  ?                          colon cancer prevention. ?Thornton Park MD, MD ?03/04/2022 10:59:27 AM ?This report has been signed electronically. ?

## 2022-03-04 NOTE — Progress Notes (Signed)
? ?Referring Provider: Burnard Bunting, MD ?Primary Care Physician:  Burnard Bunting, MD ? ?Indication for Procedure:  Insure complete resection of a colon polyp ? ? ?IMPRESSION:  ?Large tubular adenomas removed 2022, surveillance recommended in 1 year ?Appropriate candidate for monitored anesthesia care ? ?PLAN: ?Colonoscopy in the [place] today ? ? ?HPI: Chase [name] is a 76 y.o. male presents for screening colonoscopy. ? ?Colonoscopy 08/27/21 showed:  ?- Diverticulosis in the sigmoid colon and in the ascending colon. ?- Erythematous mucosa in the rectum and in the sigmoid colon. Biopsied. ?- One 20 mm tubular adenoma in the cecum, removed using injection-lift and a cold snare. Resected and ?retrieved. ?- The examination was otherwise normal on direct and retroflexion views. ? ? ? ?Past Medical History:  ?Diagnosis Date  ? Allergy   ? Arthritis   ? BPH (benign prostatic hyperplasia)   ? Dizziness   ? Mild hyperlipidemia   ? Mononucleosis 11/09/1957  ? Rosacea   ? Wolf-Parkinson-White syndrome   ? 30 years ago  ? ? ?Past Surgical History:  ?Procedure Laterality Date  ? back pain  2013  ? epidurals x2 for pain relief  ? COLONOSCOPY  2011  ? 2006, no polyps  ? COLONOSCOPY  08/2021  ? KB-MAC-goly(good)-TA  ? LAMINECTOMY  11/09/1974  ? LUMBAR LAMINECTOMY/DECOMPRESSION MICRODISCECTOMY Left 05/22/2021  ? Procedure: Left Lumbar Three-Four Lumbar Four-Five Microdiscectomy;  Surgeon: Newman Pies, MD;  Location: [facility];  Service: Neurosurgery;  Laterality: Left;  ? microdiscectemy  02/1996  ? NASAL SEPTOPLASTY W/ TURBINOPLASTY  2007  ? uvelectemy  ? NASAL SEPTUM SURGERY  11/09/1981  ? SHOULDER SURGERY  11/09/1988  ? VASECTOMY  11/09/1980  ? ? ?Current Outpatient Medications  ?Medication Sig Dispense Refill  ? cetirizine (ZYRTEC) 10 MG tablet Take 10 mg by mouth daily. Or Claritin    ? donepezil (ARICEPT) 10 MG tablet Take 10 mg by mouth at bedtime.    ? finasteride (PROSCAR) 5 MG tablet Take 5 mg by mouth daily.    ?  fluticasone (FLONASE) 50 MCG/ACT nasal spray Place 1 spray into both nostrils daily.    ? Multiple Vitamin (MULTI-VITAMIN PO) Take 1 tablet by mouth daily.    ? naproxen sodium (ALEVE) 220 MG tablet Take 220 mg by mouth 3 (three) times daily.    ? Omega-3 Fatty Acids (FISH OIL PO) Take 1,000 mg by mouth daily.    ? zolpidem (AMBIEN) 5 MG tablet Take 1 tablet (5 mg total) by mouth at bedtime as needed for sleep. 30 tablet 0  ? ?Current Facility-Administered Medications  ?Medication Dose Route Frequency Provider Last Rate Last Admin  ? 0.9 %  sodium chloride infusion  500 mL Intravenous Once Thornton Park, MD      ? ? ?Allergies as of 03/04/2022 - Review Complete 03/04/2022  ?Allergen Reaction Noted  ? Omeprazole Nausea And Vomiting 02/17/2021  ? Penicillins Other (See Comments) 09/30/2010  ? Penicillin g sodium Rash 02/17/2021  ? ? ?Family History  ?Problem Relation Age of Onset  ? Heart disease Mother   ? Cervical cancer Mother   ? Prostate cancer Father   ? Bladder Cancer Father   ? Colon cancer Neg Hx   ? Colon polyps Neg Hx   ? Esophageal cancer Neg Hx   ? Rectal cancer Neg Hx   ? Stomach cancer Neg Hx   ? ? ? ?Physical Exam: ?General:   Alert,  well-nourished, pleasant and cooperative in NAD ?Head:  Normocephalic and atraumatic. ?Eyes:  Sclera clear, no icterus.   Conjunctiva pink. ?Mouth:  No deformity or lesions.   ?Neck:  Supple; no masses or thyromegaly. ?Lungs:  Clear throughout to auscultation.   No wheezes. ?Heart:  Regular rate and rhythm; no murmurs. ?Abdomen:  Soft, non-tender, nondistended, normal bowel sounds, no rebound or guarding.  ?Msk:  Symmetrical. No boney deformities ?LAD: No inguinal or umbilical LAD ?Extremities:  No clubbing or edema. ?Neurologic:  Alert and  oriented x4;  grossly nonfocal ?Skin:  No obvious rash or bruise. ?Psych:  Alert and cooperative. Normal mood and affect. ? ? ? ? ?Studies/Results: ?No results found. ? ? ? ?Melvenia Favela L. Tarri Glenn, MD, MPH ?03/04/2022, 10:07  AM ? ? ? ?  ?

## 2022-03-04 NOTE — Progress Notes (Signed)
Called to room to assist during endoscopic procedure.  Patient ID and intended procedure confirmed with present staff. Received instructions for my participation in the procedure from the performing physician.  

## 2022-03-06 ENCOUNTER — Telehealth: Payer: Self-pay

## 2022-03-06 NOTE — Telephone Encounter (Signed)
?  Follow up Call- ? ? ?  03/04/2022  ?  9:55 AM 08/27/2021  ?  8:22 AM  ?Call back number  ?Post procedure Call Back phone  # [phone]  ?Permission to leave phone message Yes Yes  ? First follow up call, LVM ? ? ?

## 2022-03-10 ENCOUNTER — Encounter: Payer: Self-pay | Admitting: Gastroenterology

## 2022-03-10 DIAGNOSIS — R7989 Other specified abnormal findings of blood chemistry: Secondary | ICD-10-CM | POA: Diagnosis not present

## 2022-03-10 DIAGNOSIS — Z79899 Other long term (current) drug therapy: Secondary | ICD-10-CM | POA: Diagnosis not present

## 2022-03-10 DIAGNOSIS — Z125 Encounter for screening for malignant neoplasm of prostate: Secondary | ICD-10-CM | POA: Diagnosis not present

## 2022-03-23 DIAGNOSIS — N400 Enlarged prostate without lower urinary tract symptoms: Secondary | ICD-10-CM | POA: Diagnosis not present

## 2022-03-23 DIAGNOSIS — R82998 Other abnormal findings in urine: Secondary | ICD-10-CM | POA: Diagnosis not present

## 2022-03-23 DIAGNOSIS — Z1331 Encounter for screening for depression: Secondary | ICD-10-CM | POA: Diagnosis not present

## 2022-03-23 DIAGNOSIS — Z23 Encounter for immunization: Secondary | ICD-10-CM | POA: Diagnosis not present

## 2022-03-23 DIAGNOSIS — E782 Mixed hyperlipidemia: Secondary | ICD-10-CM | POA: Diagnosis not present

## 2022-03-23 DIAGNOSIS — R200 Anesthesia of skin: Secondary | ICD-10-CM | POA: Diagnosis not present

## 2022-03-23 DIAGNOSIS — Z1339 Encounter for screening examination for other mental health and behavioral disorders: Secondary | ICD-10-CM | POA: Diagnosis not present

## 2022-03-23 DIAGNOSIS — Z0000 Encounter for general adult medical examination without abnormal findings: Secondary | ICD-10-CM | POA: Diagnosis not present

## 2022-03-23 DIAGNOSIS — E663 Overweight: Secondary | ICD-10-CM | POA: Diagnosis not present

## 2022-04-03 DIAGNOSIS — M5116 Intervertebral disc disorders with radiculopathy, lumbar region: Secondary | ICD-10-CM | POA: Diagnosis not present

## 2022-04-03 DIAGNOSIS — Z6827 Body mass index (BMI) 27.0-27.9, adult: Secondary | ICD-10-CM | POA: Diagnosis not present

## 2022-04-23 DIAGNOSIS — Z23 Encounter for immunization: Secondary | ICD-10-CM | POA: Diagnosis not present

## 2022-04-23 DIAGNOSIS — S71112A Laceration without foreign body, left thigh, initial encounter: Secondary | ICD-10-CM | POA: Diagnosis not present

## 2022-05-04 DIAGNOSIS — W293XXA Contact with powered garden and outdoor hand tools and machinery, initial encounter: Secondary | ICD-10-CM | POA: Diagnosis not present

## 2022-05-04 DIAGNOSIS — S81812A Laceration without foreign body, left lower leg, initial encounter: Secondary | ICD-10-CM | POA: Diagnosis not present

## 2022-05-04 DIAGNOSIS — Z4802 Encounter for removal of sutures: Secondary | ICD-10-CM | POA: Diagnosis not present

## 2022-05-26 DIAGNOSIS — J3089 Other allergic rhinitis: Secondary | ICD-10-CM | POA: Diagnosis not present

## 2022-05-26 DIAGNOSIS — J300 Vasomotor rhinitis: Secondary | ICD-10-CM | POA: Diagnosis not present

## 2022-05-26 DIAGNOSIS — H1045 Other chronic allergic conjunctivitis: Secondary | ICD-10-CM | POA: Diagnosis not present

## 2022-08-06 DIAGNOSIS — Z23 Encounter for immunization: Secondary | ICD-10-CM | POA: Diagnosis not present

## 2022-08-07 DIAGNOSIS — Z23 Encounter for immunization: Secondary | ICD-10-CM | POA: Diagnosis not present

## 2022-10-22 DIAGNOSIS — J0140 Acute pansinusitis, unspecified: Secondary | ICD-10-CM | POA: Diagnosis not present

## 2022-10-22 DIAGNOSIS — Z6827 Body mass index (BMI) 27.0-27.9, adult: Secondary | ICD-10-CM | POA: Diagnosis not present

## 2022-10-29 DIAGNOSIS — H524 Presbyopia: Secondary | ICD-10-CM | POA: Diagnosis not present

## 2022-10-29 DIAGNOSIS — H2513 Age-related nuclear cataract, bilateral: Secondary | ICD-10-CM | POA: Diagnosis not present

## 2022-10-29 DIAGNOSIS — H5213 Myopia, bilateral: Secondary | ICD-10-CM | POA: Diagnosis not present

## 2022-11-27 DIAGNOSIS — H1045 Other chronic allergic conjunctivitis: Secondary | ICD-10-CM | POA: Diagnosis not present

## 2022-11-27 DIAGNOSIS — J300 Vasomotor rhinitis: Secondary | ICD-10-CM | POA: Diagnosis not present

## 2022-11-27 DIAGNOSIS — J3089 Other allergic rhinitis: Secondary | ICD-10-CM | POA: Diagnosis not present

## 2023-01-13 DIAGNOSIS — R051 Acute cough: Secondary | ICD-10-CM | POA: Diagnosis not present

## 2023-01-13 DIAGNOSIS — J3489 Other specified disorders of nose and nasal sinuses: Secondary | ICD-10-CM | POA: Diagnosis not present

## 2023-01-13 DIAGNOSIS — B349 Viral infection, unspecified: Secondary | ICD-10-CM | POA: Diagnosis not present

## 2023-01-13 DIAGNOSIS — Z1152 Encounter for screening for COVID-19: Secondary | ICD-10-CM | POA: Diagnosis not present

## 2023-01-13 DIAGNOSIS — R5383 Other fatigue: Secondary | ICD-10-CM | POA: Diagnosis not present

## 2023-03-31 DIAGNOSIS — Z1389 Encounter for screening for other disorder: Secondary | ICD-10-CM | POA: Diagnosis not present

## 2023-03-31 DIAGNOSIS — E663 Overweight: Secondary | ICD-10-CM | POA: Diagnosis not present

## 2023-03-31 DIAGNOSIS — Z0000 Encounter for general adult medical examination without abnormal findings: Secondary | ICD-10-CM | POA: Diagnosis not present

## 2023-03-31 DIAGNOSIS — E782 Mixed hyperlipidemia: Secondary | ICD-10-CM | POA: Diagnosis not present

## 2023-04-07 DIAGNOSIS — Z1339 Encounter for screening examination for other mental health and behavioral disorders: Secondary | ICD-10-CM | POA: Diagnosis not present

## 2023-04-07 DIAGNOSIS — G4700 Insomnia, unspecified: Secondary | ICD-10-CM | POA: Diagnosis not present

## 2023-04-07 DIAGNOSIS — M4800 Spinal stenosis, site unspecified: Secondary | ICD-10-CM | POA: Diagnosis not present

## 2023-04-07 DIAGNOSIS — Z1331 Encounter for screening for depression: Secondary | ICD-10-CM | POA: Diagnosis not present

## 2023-04-07 DIAGNOSIS — E782 Mixed hyperlipidemia: Secondary | ICD-10-CM | POA: Diagnosis not present

## 2023-04-07 DIAGNOSIS — N400 Enlarged prostate without lower urinary tract symptoms: Secondary | ICD-10-CM | POA: Diagnosis not present

## 2023-04-07 DIAGNOSIS — R82998 Other abnormal findings in urine: Secondary | ICD-10-CM | POA: Diagnosis not present

## 2023-04-07 DIAGNOSIS — E663 Overweight: Secondary | ICD-10-CM | POA: Diagnosis not present

## 2023-04-07 DIAGNOSIS — Z0000 Encounter for general adult medical examination without abnormal findings: Secondary | ICD-10-CM | POA: Diagnosis not present

## 2023-07-19 ENCOUNTER — Other Ambulatory Visit: Payer: Self-pay | Admitting: Medical Genetics

## 2023-07-19 DIAGNOSIS — Z006 Encounter for examination for normal comparison and control in clinical research program: Secondary | ICD-10-CM

## 2023-08-09 DIAGNOSIS — J343 Hypertrophy of nasal turbinates: Secondary | ICD-10-CM | POA: Diagnosis not present

## 2023-08-09 DIAGNOSIS — J310 Chronic rhinitis: Secondary | ICD-10-CM | POA: Diagnosis not present

## 2023-08-09 DIAGNOSIS — R0982 Postnasal drip: Secondary | ICD-10-CM | POA: Diagnosis not present

## 2023-08-13 DIAGNOSIS — Z23 Encounter for immunization: Secondary | ICD-10-CM | POA: Diagnosis not present

## 2023-10-11 ENCOUNTER — Ambulatory Visit (INDEPENDENT_AMBULATORY_CARE_PROVIDER_SITE_OTHER): Payer: Medicare Other | Admitting: Otolaryngology

## 2023-10-11 ENCOUNTER — Encounter (INDEPENDENT_AMBULATORY_CARE_PROVIDER_SITE_OTHER): Payer: Self-pay

## 2023-10-11 VITALS — Ht 67.0 in | Wt 175.0 lb

## 2023-10-11 DIAGNOSIS — R0981 Nasal congestion: Secondary | ICD-10-CM

## 2023-10-11 DIAGNOSIS — J310 Chronic rhinitis: Secondary | ICD-10-CM

## 2023-10-11 DIAGNOSIS — R0982 Postnasal drip: Secondary | ICD-10-CM | POA: Diagnosis not present

## 2023-10-12 DIAGNOSIS — R0982 Postnasal drip: Secondary | ICD-10-CM | POA: Insufficient documentation

## 2023-10-12 DIAGNOSIS — J310 Chronic rhinitis: Secondary | ICD-10-CM | POA: Insufficient documentation

## 2023-10-12 NOTE — Progress Notes (Signed)
Patient ID: Chase [name], male   DOB: 10/09/1946, 77 y.o.   MRN: [id number]  Follow-up: Chronic nasal drainage  HPI: The patient is a 77 year old male who returns today for his follow-up evaluation.  The patient was last seen in September 2024.  At that time, he was complaining of chronic postnasal drainage.  He was noted to have nasal mucosal congestion, bilateral inferior turbinate hypertrophy, and chronic postnasal drainage.  His history and physical exam findings were consistent with vasomotor rhinitis.  He was treated with Atrovent nasal spray.  The patient returns today reporting significant improvement in his nasal drainage with the use of Atrovent.  His nasal congestion has also improved.  Currently he denies any facial pain, fever, or visual change.  Exam: General: Communicates without difficulty, well nourished, no acute distress. Head: Normocephalic, no evidence injury, no tenderness, facial buttresses intact without stepoff. Face/sinus: No tenderness to palpation and percussion. Facial movement is normal and symmetric. Eyes: PERRL, EOMI. No scleral icterus, conjunctivae clear. Neuro: CN II exam reveals vision grossly intact.  No nystagmus at any point of gaze. Ears: Auricles well formed without lesions.  Ear canals are intact without mass or lesion.  No erythema or edema is appreciated.  The TMs are intact without fluid. Nose: External evaluation reveals normal support and skin without lesions.  Dorsum is intact.  Anterior rhinoscopy reveals congested mucosa over anterior aspect of inferior turbinates and intact septum.  No purulence noted. Oral:  Oral cavity and oropharynx are intact, symmetric, without erythema or edema.  Mucosa is moist without lesions. Neck: Full range of motion without pain.  There is no significant lymphadenopathy.  No masses palpable.  Thyroid bed within normal limits to palpation.  Parotid glands and submandibular glands equal bilaterally without mass.  Trachea is midline.  Neuro:  CN 2-12 grossly intact.   Assessment: 1.  Chronic/vasomotor rhinitis, with nasal mucosal congestion and chronic postnasal drainage. 2.  His symptoms have improved with the use of Atrovent twice daily.  Plan: 1.  The physical exam findings are reviewed with the patient. 2.  Continue the use of Atrovent as needed to treat his vasomotor rhinitis. 3.  Nasal saline irrigation is encouraged. 4.  The patient is encouraged to call with any questions or concerns.

## 2023-11-15 DIAGNOSIS — R051 Acute cough: Secondary | ICD-10-CM | POA: Diagnosis not present

## 2023-11-15 DIAGNOSIS — J069 Acute upper respiratory infection, unspecified: Secondary | ICD-10-CM | POA: Diagnosis not present

## 2023-11-25 DIAGNOSIS — H5213 Myopia, bilateral: Secondary | ICD-10-CM | POA: Diagnosis not present

## 2023-11-25 DIAGNOSIS — H2513 Age-related nuclear cataract, bilateral: Secondary | ICD-10-CM | POA: Diagnosis not present

## 2024-01-18 DIAGNOSIS — R2231 Localized swelling, mass and lump, right upper limb: Secondary | ICD-10-CM | POA: Diagnosis not present

## 2024-02-04 ENCOUNTER — Other Ambulatory Visit

## 2024-02-04 DIAGNOSIS — Z006 Encounter for examination for normal comparison and control in clinical research program: Secondary | ICD-10-CM

## 2024-02-19 LAB — GENECONNECT MOLECULAR SCREEN: Genetic Analysis Overall Interpretation: NEGATIVE

## 2024-03-10 DIAGNOSIS — R197 Diarrhea, unspecified: Secondary | ICD-10-CM | POA: Diagnosis not present

## 2024-03-10 DIAGNOSIS — A084 Viral intestinal infection, unspecified: Secondary | ICD-10-CM | POA: Diagnosis not present

## 2024-03-10 DIAGNOSIS — K5790 Diverticulosis of intestine, part unspecified, without perforation or abscess without bleeding: Secondary | ICD-10-CM | POA: Diagnosis not present

## 2024-03-22 DIAGNOSIS — M67441 Ganglion, right hand: Secondary | ICD-10-CM | POA: Diagnosis not present

## 2024-03-22 DIAGNOSIS — M19041 Primary osteoarthritis, right hand: Secondary | ICD-10-CM | POA: Diagnosis not present

## 2024-03-22 DIAGNOSIS — M19042 Primary osteoarthritis, left hand: Secondary | ICD-10-CM | POA: Diagnosis not present

## 2024-04-05 DIAGNOSIS — Z1212 Encounter for screening for malignant neoplasm of rectum: Secondary | ICD-10-CM | POA: Diagnosis not present

## 2024-04-05 DIAGNOSIS — E782 Mixed hyperlipidemia: Secondary | ICD-10-CM | POA: Diagnosis not present

## 2024-04-05 DIAGNOSIS — N400 Enlarged prostate without lower urinary tract symptoms: Secondary | ICD-10-CM | POA: Diagnosis not present

## 2024-04-12 DIAGNOSIS — Z1339 Encounter for screening examination for other mental health and behavioral disorders: Secondary | ICD-10-CM | POA: Diagnosis not present

## 2024-04-12 DIAGNOSIS — Z0000 Encounter for general adult medical examination without abnormal findings: Secondary | ICD-10-CM | POA: Diagnosis not present

## 2024-04-12 DIAGNOSIS — M4800 Spinal stenosis, site unspecified: Secondary | ICD-10-CM | POA: Diagnosis not present

## 2024-04-12 DIAGNOSIS — R82998 Other abnormal findings in urine: Secondary | ICD-10-CM | POA: Diagnosis not present

## 2024-04-12 DIAGNOSIS — K429 Umbilical hernia without obstruction or gangrene: Secondary | ICD-10-CM | POA: Diagnosis not present

## 2024-04-12 DIAGNOSIS — M159 Polyosteoarthritis, unspecified: Secondary | ICD-10-CM | POA: Diagnosis not present

## 2024-04-12 DIAGNOSIS — Z1331 Encounter for screening for depression: Secondary | ICD-10-CM | POA: Diagnosis not present

## 2024-04-12 DIAGNOSIS — E782 Mixed hyperlipidemia: Secondary | ICD-10-CM | POA: Diagnosis not present

## 2024-05-01 DIAGNOSIS — Z1212 Encounter for screening for malignant neoplasm of rectum: Secondary | ICD-10-CM | POA: Diagnosis not present

## 2024-05-18 DIAGNOSIS — R197 Diarrhea, unspecified: Secondary | ICD-10-CM | POA: Diagnosis not present

## 2024-05-18 DIAGNOSIS — H6993 Unspecified Eustachian tube disorder, bilateral: Secondary | ICD-10-CM | POA: Diagnosis not present

## 2024-05-18 DIAGNOSIS — H6123 Impacted cerumen, bilateral: Secondary | ICD-10-CM | POA: Diagnosis not present

## 2024-05-18 DIAGNOSIS — Z1389 Encounter for screening for other disorder: Secondary | ICD-10-CM | POA: Diagnosis not present

## 2024-06-26 DIAGNOSIS — E782 Mixed hyperlipidemia: Secondary | ICD-10-CM | POA: Diagnosis not present

## 2024-06-30 DIAGNOSIS — H9313 Tinnitus, bilateral: Secondary | ICD-10-CM | POA: Diagnosis not present

## 2024-06-30 DIAGNOSIS — R42 Dizziness and giddiness: Secondary | ICD-10-CM | POA: Diagnosis not present

## 2024-06-30 DIAGNOSIS — H6993 Unspecified Eustachian tube disorder, bilateral: Secondary | ICD-10-CM | POA: Diagnosis not present

## 2024-07-05 ENCOUNTER — Encounter (INDEPENDENT_AMBULATORY_CARE_PROVIDER_SITE_OTHER): Payer: Self-pay

## 2024-07-19 DIAGNOSIS — M67441 Ganglion, right hand: Secondary | ICD-10-CM | POA: Diagnosis not present

## 2024-07-19 DIAGNOSIS — Z4789 Encounter for other orthopedic aftercare: Secondary | ICD-10-CM | POA: Diagnosis not present

## 2024-07-28 DIAGNOSIS — Z23 Encounter for immunization: Secondary | ICD-10-CM | POA: Diagnosis not present

## 2024-08-01 DIAGNOSIS — R200 Anesthesia of skin: Secondary | ICD-10-CM | POA: Diagnosis not present

## 2024-08-07 ENCOUNTER — Encounter (INDEPENDENT_AMBULATORY_CARE_PROVIDER_SITE_OTHER): Payer: Self-pay | Admitting: Otolaryngology

## 2024-08-07 ENCOUNTER — Ambulatory Visit (INDEPENDENT_AMBULATORY_CARE_PROVIDER_SITE_OTHER): Admitting: Otolaryngology

## 2024-08-07 VITALS — BP 125/80 | HR 61 | Temp 97.4°F | Ht 67.0 in | Wt 175.0 lb

## 2024-08-07 DIAGNOSIS — J300 Vasomotor rhinitis: Secondary | ICD-10-CM

## 2024-08-07 DIAGNOSIS — R0982 Postnasal drip: Secondary | ICD-10-CM | POA: Diagnosis not present

## 2024-08-07 DIAGNOSIS — H9313 Tinnitus, bilateral: Secondary | ICD-10-CM

## 2024-08-07 DIAGNOSIS — J310 Chronic rhinitis: Secondary | ICD-10-CM

## 2024-08-08 DIAGNOSIS — H9313 Tinnitus, bilateral: Secondary | ICD-10-CM | POA: Insufficient documentation

## 2024-08-08 NOTE — Progress Notes (Signed)
 Patient ID: Chase [name], male   DOB: 1946/05/22, 78 y.o.   MRN: [id number]  CC: Chronic postnasal drainage, bilateral tinnitus  HPI: The patient is a 78 year old male who returns today for his follow-up evaluation.  The patient was last seen in December 2024.  At that time, he was complaining of chronic postnasal drainage.  He was treated with Atrovent  nasal spray and nasal saline irrigation.  The patient returns today complaining of more recurrent nasal drainage.  He is currently on Flonase , Xyzal, and Atrovent .  He denies any facial pain or fever.  In addition, he also complains of bilateral high-pitched tinnitus.  The tinnitus is nonpulsatile.  He denies any significant hearing difficulty.  Exam: General: Communicates without difficulty, well nourished, no acute distress. Head: Normocephalic, no evidence injury, no tenderness, facial buttresses intact without stepoff. Face/sinus: No tenderness to palpation and percussion. Facial movement is normal and symmetric. Eyes: PERRL, EOMI. No scleral icterus, conjunctivae clear. Neuro: CN II exam reveals vision grossly intact.  No nystagmus at any point of gaze. Ears: Auricles well formed without lesions.  Ear canals are intact without mass or lesion.  No erythema or edema is appreciated.  The TMs are intact without fluid. Nose: External evaluation reveals normal support and skin without lesions.  Dorsum is intact.  Anterior rhinoscopy reveals congested mucosa over anterior aspect of inferior turbinates and intact septum.  No purulence noted. Oral:  Oral cavity and oropharynx are intact, symmetric, without erythema or edema.  Mucosa is moist without lesions. Neck: Full range of motion without pain.  There is no significant lymphadenopathy.  No masses palpable.  Thyroid  bed within normal limits to palpation.  Parotid glands and submandibular glands equal bilaterally without mass.  Trachea is midline. Neuro:  CN 2-12 grossly intact.   Assessment: 1.   Chronic/vasomotor rhinitis.  The patient has been experiencing chronic postnasal drainage. 2.  No acute infection or purulent drainage is noted today. 3.  Bilateral subjective tinnitus.  Plan: 1.  The physical exam findings are reviewed with the patient. 2.  Continue with Atrovent , Flonase , and Xyzal. 3.  Nasal saline irrigation is encouraged. 4.  The strategies to cope with tinnitus, including the use of masker, hearing aids, tinnitus retraining therapy, and avoidance of caffeine and alcohol  are discussed. 5.  The patient is encouraged to call with any questions or concerns.

## 2024-08-11 DIAGNOSIS — Z23 Encounter for immunization: Secondary | ICD-10-CM | POA: Diagnosis not present

## 2024-08-15 DIAGNOSIS — M5126 Other intervertebral disc displacement, lumbar region: Secondary | ICD-10-CM | POA: Diagnosis not present

## 2024-08-15 DIAGNOSIS — M51369 Other intervertebral disc degeneration, lumbar region without mention of lumbar back pain or lower extremity pain: Secondary | ICD-10-CM | POA: Diagnosis not present

## 2024-08-15 DIAGNOSIS — M51379 Other intervertebral disc degeneration, lumbosacral region without mention of lumbar back pain or lower extremity pain: Secondary | ICD-10-CM | POA: Diagnosis not present

## 2024-08-15 DIAGNOSIS — M47816 Spondylosis without myelopathy or radiculopathy, lumbar region: Secondary | ICD-10-CM | POA: Diagnosis not present

## 2024-08-15 DIAGNOSIS — R200 Anesthesia of skin: Secondary | ICD-10-CM | POA: Diagnosis not present

## 2024-09-05 DIAGNOSIS — M48061 Spinal stenosis, lumbar region without neurogenic claudication: Secondary | ICD-10-CM | POA: Diagnosis not present

## 2024-09-05 DIAGNOSIS — Z6828 Body mass index (BMI) 28.0-28.9, adult: Secondary | ICD-10-CM | POA: Diagnosis not present

## 2024-09-21 ENCOUNTER — Telehealth (INDEPENDENT_AMBULATORY_CARE_PROVIDER_SITE_OTHER): Payer: Self-pay | Admitting: Otolaryngology

## 2024-09-21 NOTE — Telephone Encounter (Signed)
 Atrovent  0.06 % nasal spray was called into [facility] ([phone]) with 5 refills.
